# Patient Record
Sex: Male | Born: 1940 | Race: White | Hispanic: No | Marital: Married | Smoking: Former smoker
Health system: Southern US, Community
[De-identification: ages and names within clinical notes are randomized; demographics above are authoritative.]

## PROBLEM LIST (undated history)

## (undated) DIAGNOSIS — K604 Rectal fistula, unspecified: Secondary | ICD-10-CM

## (undated) DIAGNOSIS — Z973 Presence of spectacles and contact lenses: Secondary | ICD-10-CM

## (undated) DIAGNOSIS — K219 Gastro-esophageal reflux disease without esophagitis: Secondary | ICD-10-CM

## (undated) DIAGNOSIS — E785 Hyperlipidemia, unspecified: Secondary | ICD-10-CM

## (undated) DIAGNOSIS — I10 Essential (primary) hypertension: Secondary | ICD-10-CM

## (undated) HISTORY — DX: Gastro-esophageal reflux disease without esophagitis: K21.9

## (undated) HISTORY — DX: Hyperlipidemia, unspecified: E78.5

## (undated) HISTORY — DX: Rectal fistula: K60.4

## (undated) HISTORY — DX: Presence of spectacles and contact lenses: Z97.3

## (undated) HISTORY — PX: COLECTOMY: SHX59

## (undated) HISTORY — PX: ELBOW SURGERY: SHX618

## (undated) HISTORY — DX: Essential (primary) hypertension: I10

## (undated) HISTORY — DX: Rectal fistula, unspecified: K60.40

---

## 1999-04-07 ENCOUNTER — Encounter (INDEPENDENT_AMBULATORY_CARE_PROVIDER_SITE_OTHER): Payer: Self-pay | Admitting: Specialist

## 1999-04-07 ENCOUNTER — Other Ambulatory Visit: Admission: RE | Admit: 1999-04-07 | Discharge: 1999-04-07 | Payer: Self-pay | Admitting: Gastroenterology

## 2001-01-03 ENCOUNTER — Ambulatory Visit (HOSPITAL_COMMUNITY): Admission: RE | Admit: 2001-01-03 | Discharge: 2001-01-03 | Payer: Self-pay | Admitting: Orthopedic Surgery

## 2001-01-03 ENCOUNTER — Encounter: Payer: Self-pay | Admitting: Orthopedic Surgery

## 2001-01-27 ENCOUNTER — Other Ambulatory Visit: Admission: RE | Admit: 2001-01-27 | Discharge: 2001-01-27 | Payer: Self-pay | Admitting: Gastroenterology

## 2001-01-27 ENCOUNTER — Encounter: Payer: Self-pay | Admitting: Gastroenterology

## 2001-01-27 ENCOUNTER — Encounter (INDEPENDENT_AMBULATORY_CARE_PROVIDER_SITE_OTHER): Payer: Self-pay

## 2001-01-30 ENCOUNTER — Observation Stay (HOSPITAL_COMMUNITY): Admission: RE | Admit: 2001-01-30 | Discharge: 2001-01-31 | Payer: Self-pay | Admitting: Orthopedic Surgery

## 2001-01-30 ENCOUNTER — Encounter (INDEPENDENT_AMBULATORY_CARE_PROVIDER_SITE_OTHER): Payer: Self-pay | Admitting: Specialist

## 2005-02-28 ENCOUNTER — Ambulatory Visit: Payer: Self-pay | Admitting: Internal Medicine

## 2005-03-15 ENCOUNTER — Ambulatory Visit: Payer: Self-pay | Admitting: Internal Medicine

## 2005-04-11 ENCOUNTER — Ambulatory Visit: Payer: Self-pay | Admitting: Internal Medicine

## 2006-02-18 ENCOUNTER — Ambulatory Visit: Payer: Self-pay | Admitting: Internal Medicine

## 2006-05-21 ENCOUNTER — Ambulatory Visit: Payer: Self-pay | Admitting: Internal Medicine

## 2006-07-02 ENCOUNTER — Ambulatory Visit: Payer: Self-pay | Admitting: Internal Medicine

## 2006-07-09 ENCOUNTER — Ambulatory Visit: Payer: Self-pay | Admitting: Internal Medicine

## 2006-09-12 ENCOUNTER — Ambulatory Visit: Payer: Self-pay | Admitting: Internal Medicine

## 2006-09-12 LAB — CONVERTED CEMR LAB
ALT: 23 units/L (ref 0–40)
AST: 23 units/L (ref 0–37)
Chol/HDL Ratio, serum: 3.3
Cholesterol: 141 mg/dL (ref 0–200)
HDL: 42.4 mg/dL (ref >39.0)
LDL Cholesterol: 84 mg/dL (ref 0–99)
Triglyceride fasting, serum: 71 mg/dL (ref 0–149)
VLDL: 14 mg/dL (ref 0–40)

## 2006-11-13 ENCOUNTER — Ambulatory Visit: Payer: Self-pay | Admitting: Internal Medicine

## 2006-11-25 ENCOUNTER — Ambulatory Visit: Payer: Self-pay | Admitting: Internal Medicine

## 2007-03-10 ENCOUNTER — Encounter: Payer: Self-pay | Admitting: Internal Medicine

## 2007-06-11 ENCOUNTER — Encounter: Payer: Self-pay | Admitting: Internal Medicine

## 2007-06-19 ENCOUNTER — Ambulatory Visit: Payer: Self-pay | Admitting: Internal Medicine

## 2007-10-02 HISTORY — PX: COLONOSCOPY W/ POLYPECTOMY: SHX1380

## 2007-10-08 ENCOUNTER — Telehealth (INDEPENDENT_AMBULATORY_CARE_PROVIDER_SITE_OTHER): Payer: Self-pay | Admitting: *Deleted

## 2008-02-25 ENCOUNTER — Ambulatory Visit: Payer: Self-pay | Admitting: Internal Medicine

## 2008-02-25 DIAGNOSIS — K2270 Barrett's esophagus without dysplasia: Secondary | ICD-10-CM

## 2008-02-25 DIAGNOSIS — E785 Hyperlipidemia, unspecified: Secondary | ICD-10-CM

## 2008-02-25 DIAGNOSIS — K219 Gastro-esophageal reflux disease without esophagitis: Secondary | ICD-10-CM

## 2008-02-25 DIAGNOSIS — E119 Type 2 diabetes mellitus without complications: Secondary | ICD-10-CM

## 2008-02-25 DIAGNOSIS — K5730 Diverticulosis of large intestine without perforation or abscess without bleeding: Secondary | ICD-10-CM

## 2008-02-25 DIAGNOSIS — I10 Essential (primary) hypertension: Secondary | ICD-10-CM

## 2008-02-25 DIAGNOSIS — Z87442 Personal history of urinary calculi: Secondary | ICD-10-CM

## 2008-02-25 LAB — CONVERTED CEMR LAB: Cholesterol, target level: 200 mg/dL

## 2008-03-19 DIAGNOSIS — K449 Diaphragmatic hernia without obstruction or gangrene: Secondary | ICD-10-CM | POA: Insufficient documentation

## 2008-04-22 ENCOUNTER — Ambulatory Visit: Payer: Self-pay | Admitting: Internal Medicine

## 2008-04-30 ENCOUNTER — Encounter (INDEPENDENT_AMBULATORY_CARE_PROVIDER_SITE_OTHER): Payer: Self-pay | Admitting: *Deleted

## 2008-04-30 LAB — CONVERTED CEMR LAB
AST: 19 units/L (ref 0–37)
BUN: 10 mg/dL (ref 6–23)
Basophils Absolute: 0.1 10*3/uL (ref 0.0–0.1)
Bilirubin, Direct: 0.1 mg/dL (ref 0.0–0.3)
Cholesterol: 124 mg/dL (ref 0–200)
Creatinine,U: 118.5 mg/dL
HDL: 32.3 mg/dL — ABNORMAL LOW (ref >39.0)
Hemoglobin: 14.5 g/dL (ref 13.0–17.0)
Hgb A1c MFr Bld: 6.4 % — ABNORMAL HIGH (ref 4.6–6.0)
LDL Cholesterol: 71 mg/dL (ref 0–99)
Lymphocytes Relative: 18.7 % (ref 12.0–46.0)
MCHC: 35.4 g/dL (ref 30.0–36.0)
Microalb, Ur: 0.5 mg/dL (ref 0.0–1.9)
Monocytes Absolute: 0.4 10*3/uL (ref 0.1–1.0)
Neutro Abs: 3.7 10*3/uL (ref 1.4–7.7)
Platelets: 253 10*3/uL (ref 150–400)
Potassium: 4.1 meq/L (ref 3.5–5.1)
RDW: 12.3 % (ref 11.5–14.6)
Total Bilirubin: 1.5 mg/dL — ABNORMAL HIGH (ref 0.3–1.2)
Triglycerides: 102 mg/dL (ref 0–149)

## 2008-05-18 ENCOUNTER — Ambulatory Visit: Payer: Self-pay | Admitting: Gastroenterology

## 2008-06-09 ENCOUNTER — Encounter: Payer: Self-pay | Admitting: Gastroenterology

## 2008-06-09 ENCOUNTER — Ambulatory Visit: Payer: Self-pay | Admitting: Gastroenterology

## 2008-06-11 ENCOUNTER — Encounter: Payer: Self-pay | Admitting: Gastroenterology

## 2008-08-13 ENCOUNTER — Telehealth (INDEPENDENT_AMBULATORY_CARE_PROVIDER_SITE_OTHER): Payer: Self-pay | Admitting: *Deleted

## 2008-09-09 ENCOUNTER — Ambulatory Visit: Payer: Self-pay | Admitting: Internal Medicine

## 2008-09-09 DIAGNOSIS — Z860100 Personal history of colon polyps, unspecified: Secondary | ICD-10-CM | POA: Insufficient documentation

## 2008-09-10 ENCOUNTER — Ambulatory Visit: Payer: Self-pay | Admitting: Internal Medicine

## 2008-09-12 LAB — CONVERTED CEMR LAB
Alkaline Phosphatase: 60 units/L (ref 39–117)
BUN: 17 mg/dL (ref 6–23)
Bilirubin, Direct: 0.1 mg/dL (ref 0.0–0.3)
Creatinine, Ser: 1.3 mg/dL (ref 0.4–1.5)
Hgb A1c MFr Bld: 6.4 % — ABNORMAL HIGH (ref 4.6–6.0)
Microalb Creat Ratio: 1.4 mg/g (ref 0.0–30.0)
Potassium: 4.5 meq/L (ref 3.5–5.1)
Total Bilirubin: 1.1 mg/dL (ref 0.3–1.2)
Total CHOL/HDL Ratio: 4.4
VLDL: 44 mg/dL — ABNORMAL HIGH (ref 0–40)

## 2008-09-13 ENCOUNTER — Encounter (INDEPENDENT_AMBULATORY_CARE_PROVIDER_SITE_OTHER): Payer: Self-pay | Admitting: *Deleted

## 2008-12-29 ENCOUNTER — Telehealth (INDEPENDENT_AMBULATORY_CARE_PROVIDER_SITE_OTHER): Payer: Self-pay | Admitting: *Deleted

## 2009-03-04 ENCOUNTER — Ambulatory Visit: Payer: Self-pay | Admitting: Internal Medicine

## 2009-03-04 LAB — CONVERTED CEMR LAB
Hgb A1c MFr Bld: 6.5 % (ref 4.6–6.5)
LDL Cholesterol: 85 mg/dL (ref 0–99)
Total CHOL/HDL Ratio: 4
Triglycerides: 137 mg/dL (ref 0.0–149.0)

## 2009-03-08 ENCOUNTER — Ambulatory Visit: Payer: Self-pay | Admitting: Internal Medicine

## 2009-05-04 ENCOUNTER — Encounter: Payer: Self-pay | Admitting: Internal Medicine

## 2009-08-04 ENCOUNTER — Telehealth (INDEPENDENT_AMBULATORY_CARE_PROVIDER_SITE_OTHER): Payer: Self-pay | Admitting: *Deleted

## 2009-08-16 ENCOUNTER — Telehealth (INDEPENDENT_AMBULATORY_CARE_PROVIDER_SITE_OTHER): Payer: Self-pay | Admitting: *Deleted

## 2009-09-06 ENCOUNTER — Ambulatory Visit: Payer: Self-pay | Admitting: Internal Medicine

## 2009-09-11 LAB — CONVERTED CEMR LAB
Hgb A1c MFr Bld: 6.3 % (ref 4.6–6.5)
Microalb, Ur: 2.4 mg/dL — ABNORMAL HIGH (ref 0.0–1.9)

## 2009-09-12 ENCOUNTER — Encounter (INDEPENDENT_AMBULATORY_CARE_PROVIDER_SITE_OTHER): Payer: Self-pay | Admitting: *Deleted

## 2009-12-19 ENCOUNTER — Ambulatory Visit: Payer: Self-pay | Admitting: Gastroenterology

## 2009-12-19 DIAGNOSIS — R1013 Epigastric pain: Secondary | ICD-10-CM | POA: Insufficient documentation

## 2009-12-19 DIAGNOSIS — R159 Full incontinence of feces: Secondary | ICD-10-CM | POA: Insufficient documentation

## 2009-12-19 DIAGNOSIS — K589 Irritable bowel syndrome without diarrhea: Secondary | ICD-10-CM | POA: Insufficient documentation

## 2009-12-20 ENCOUNTER — Encounter: Payer: Self-pay | Admitting: Gastroenterology

## 2009-12-22 DIAGNOSIS — E538 Deficiency of other specified B group vitamins: Secondary | ICD-10-CM | POA: Insufficient documentation

## 2009-12-22 LAB — CONVERTED CEMR LAB
Ferritin: 39.8 ng/mL (ref 22.0–322.0)
Saturation Ratios: 32.9 % (ref 20.0–50.0)
Transferrin: 232.4 mg/dL (ref 212.0–360.0)

## 2009-12-23 ENCOUNTER — Ambulatory Visit (HOSPITAL_COMMUNITY): Admission: RE | Admit: 2009-12-23 | Discharge: 2009-12-23 | Payer: Self-pay | Admitting: Gastroenterology

## 2009-12-23 ENCOUNTER — Ambulatory Visit: Payer: Self-pay | Admitting: Gastroenterology

## 2009-12-23 IMAGING — US US ABDOMEN COMPLETE
1 series · 14 of 17 positions shown · non-contrast
Comparison: None.

CLINICAL DATA: Abdominal pain.

ABDOMINAL ULTRASOUND COMPLETE

[Series 1: us abdomen complete · 0.26mm/px · 14 of 17 slices shown]
[im 1/17]
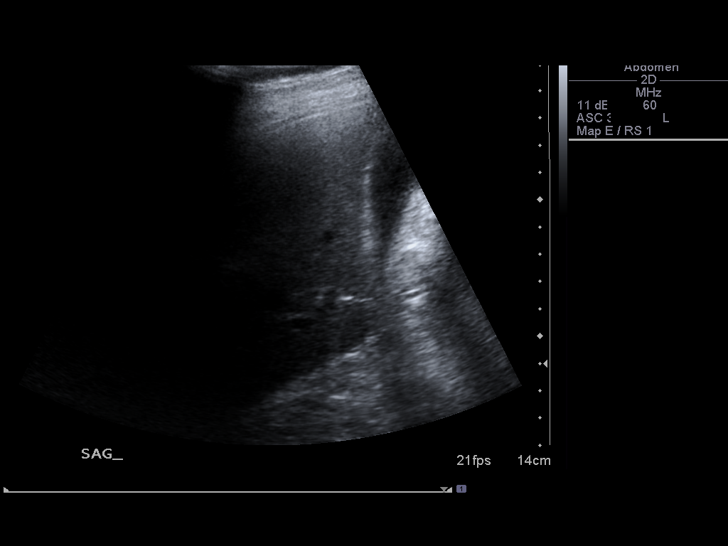
[im 2/17]
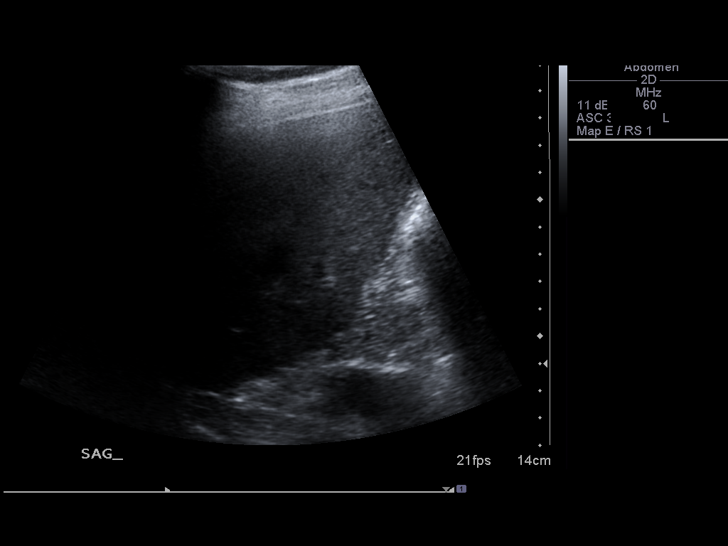
[im 4/17]
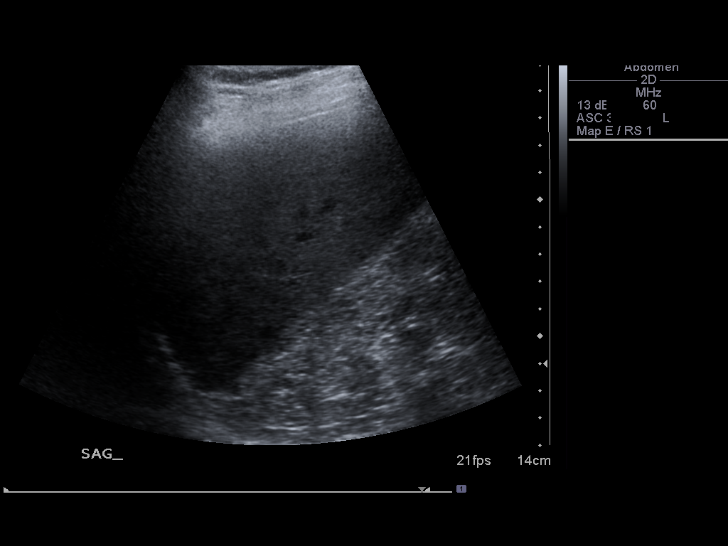
[im 5/17]
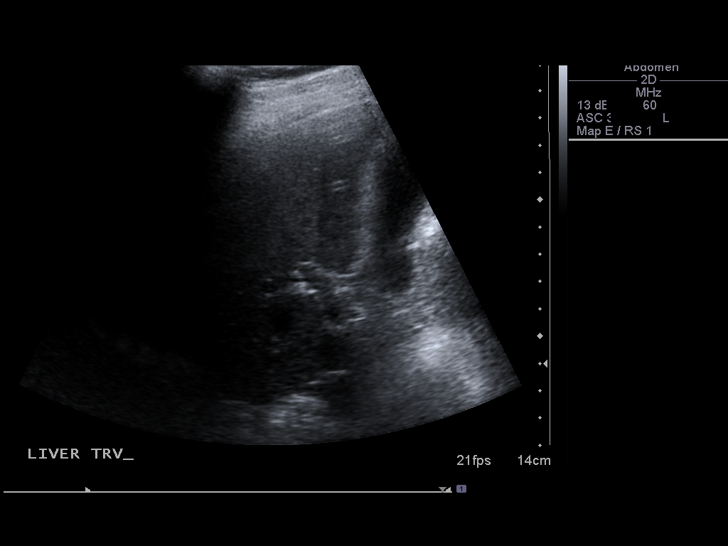
[im 6/17]
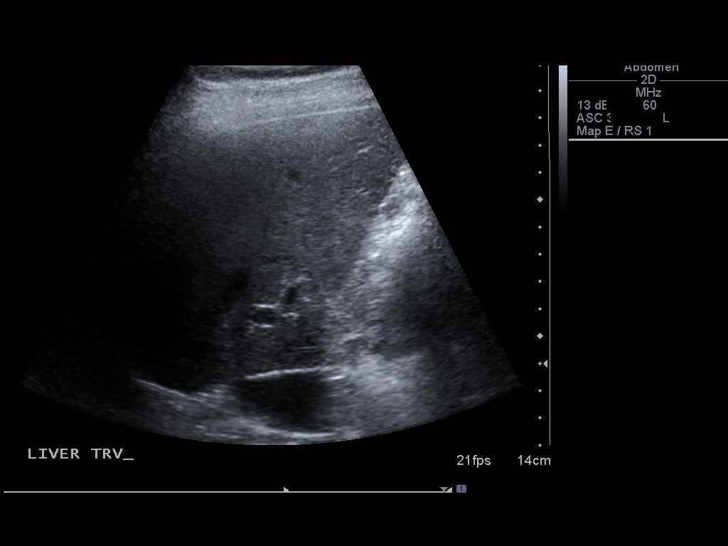
[im 7/17]
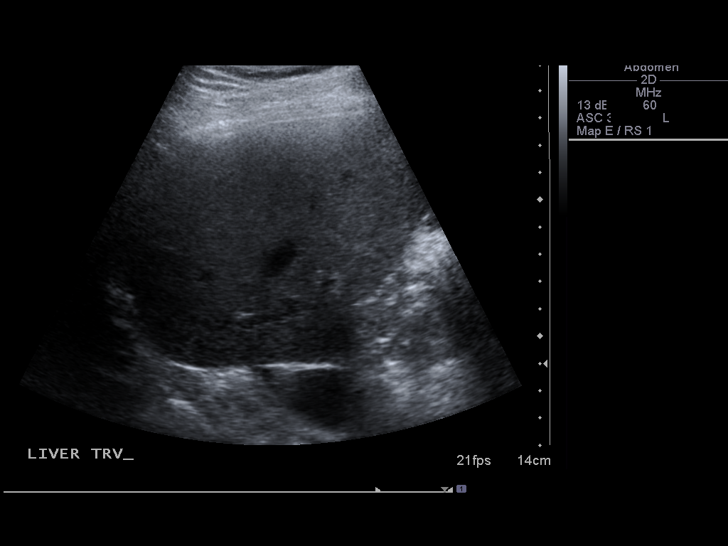
[im 8/17]
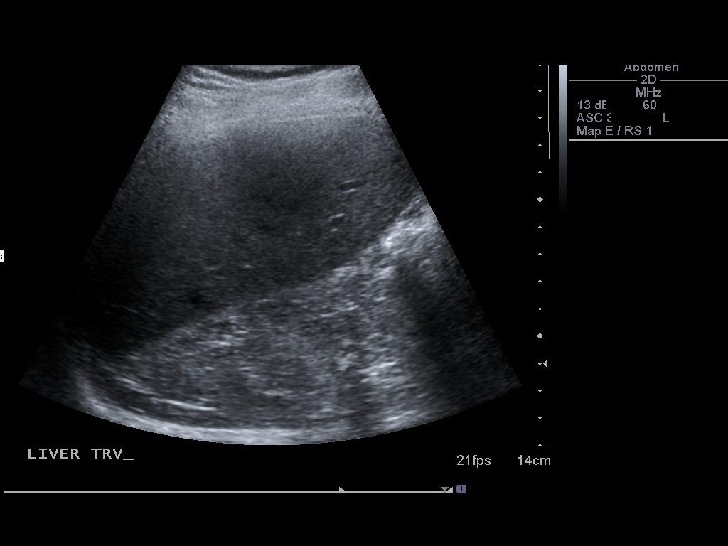
[im 10/17]
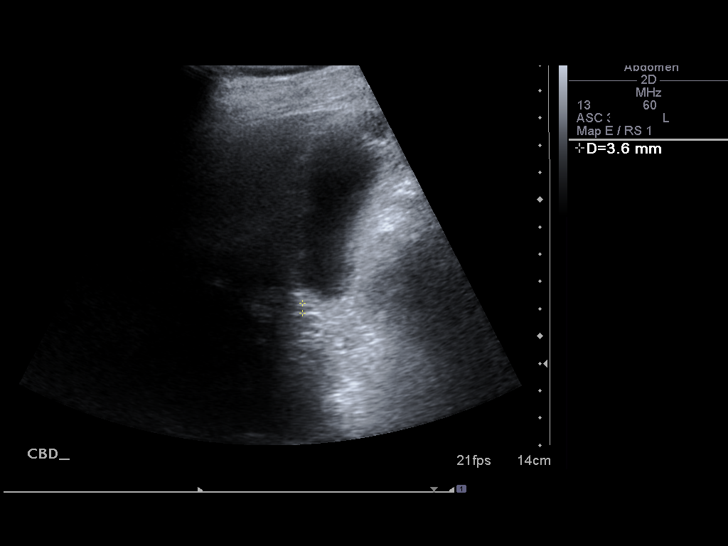
[im 11/17]
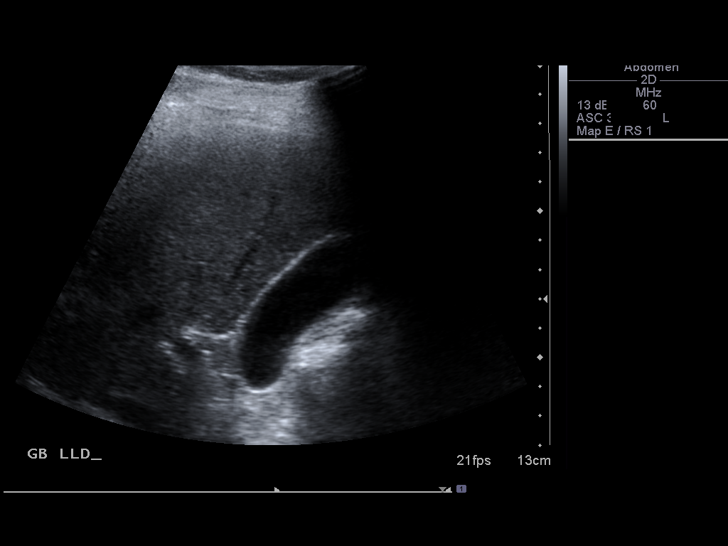
[im 12/17]
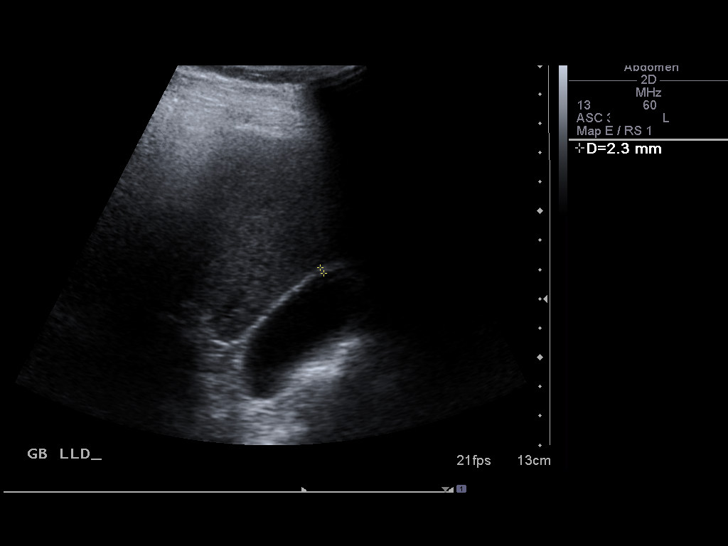
[im 13/17]
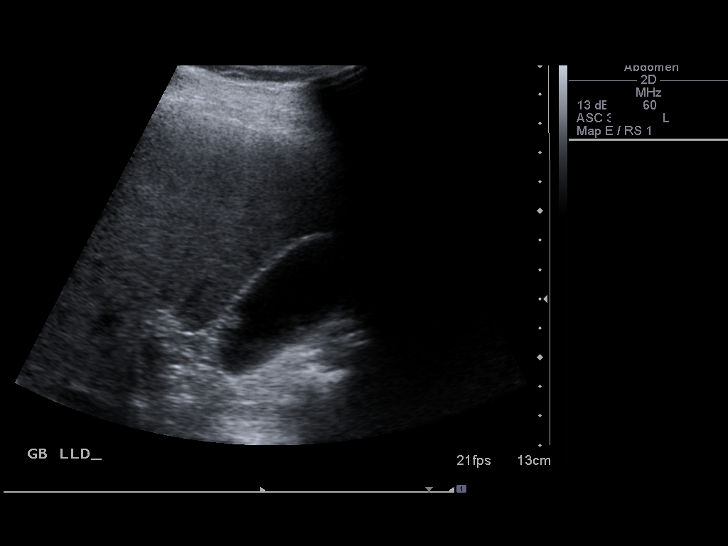
[im 14/17]
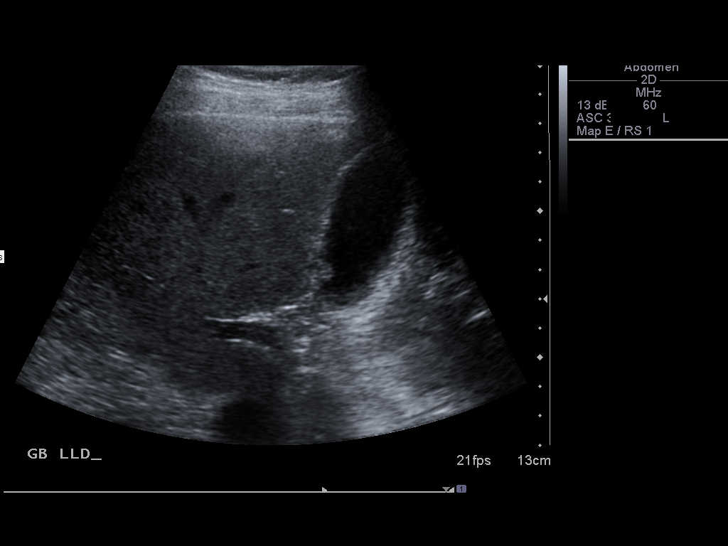
[im 16/17]
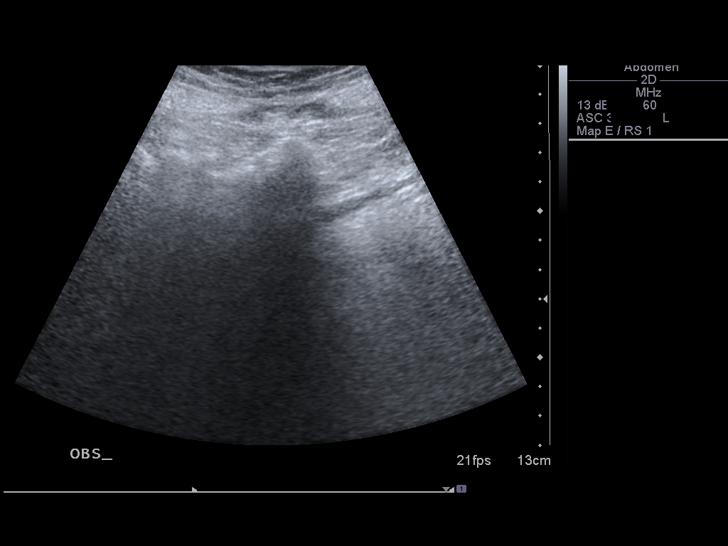
[im 17/17]
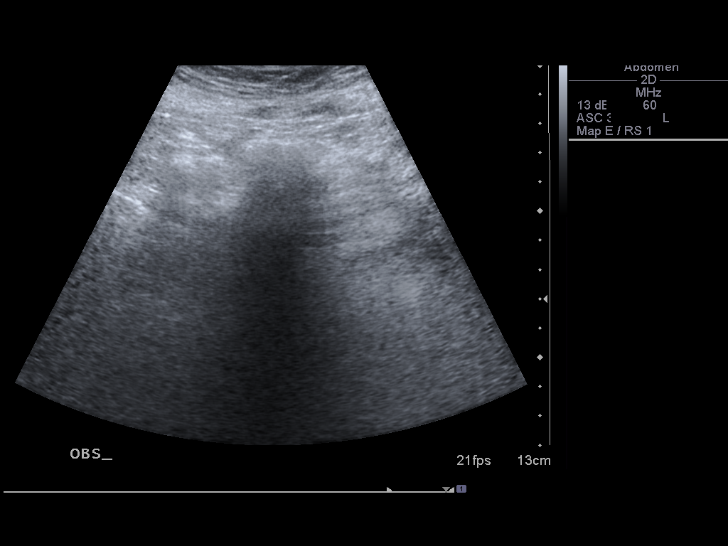

[14 of 17 positions shown; findings below may reference images not displayed]

FINDINGS: Gallbladder:  No gallstones, gallbladder wall thickening, or
pericholecystic fluid. 5 - 6 mm gallbladder polyp.

Common Bile Duct:  Within normal limits in caliber.

Liver:  No focal lesion identified.  Within normal limits in
parenchymal echogenicity.

IVC:  Appears normal.

Pancreas: Suboptimal visualization of the pancreas due to overlying
bowel gas.

Spleen:  Within normal limits in size and echotexture. 4.7 cm
splenic length.

Right kidney:  Normal in size and parenchymal echogenicity.  No
evidence of mass or hydronephrosis. Echogenic focus in the right
kidney may represent a stone.  10.4 cm right renal length.

Left kidney:  Normal in size and parenchymal echogenicity. 10.7 cm
left renal length.  2.7 x 2.7 x 3.1 cm cyst in the upper pole.
x 2.6 x 1.9 cm cyst in the upper pole.

Abdominal Aorta:  No aneurysm identified. 2.5 cm maximum diameter
of the aorta.
IMPRESSION: Gallbladder polyp.  No gallstones.  No biliary ductal dilatation.
Left renal cyst.

## 2009-12-29 ENCOUNTER — Telehealth: Payer: Self-pay | Admitting: Gastroenterology

## 2009-12-30 ENCOUNTER — Ambulatory Visit: Payer: Self-pay | Admitting: Gastroenterology

## 2010-01-02 ENCOUNTER — Telehealth (INDEPENDENT_AMBULATORY_CARE_PROVIDER_SITE_OTHER): Payer: Self-pay | Admitting: *Deleted

## 2010-01-03 ENCOUNTER — Encounter (INDEPENDENT_AMBULATORY_CARE_PROVIDER_SITE_OTHER): Payer: Self-pay

## 2010-01-05 ENCOUNTER — Encounter (INDEPENDENT_AMBULATORY_CARE_PROVIDER_SITE_OTHER): Payer: Self-pay

## 2010-01-05 ENCOUNTER — Ambulatory Visit: Payer: Self-pay | Admitting: Gastroenterology

## 2010-01-06 ENCOUNTER — Ambulatory Visit: Payer: Self-pay | Admitting: Gastroenterology

## 2010-01-11 ENCOUNTER — Ambulatory Visit: Payer: Self-pay | Admitting: Gastroenterology

## 2010-01-11 ENCOUNTER — Telehealth: Payer: Self-pay | Admitting: Gastroenterology

## 2010-01-13 ENCOUNTER — Telehealth: Payer: Self-pay | Admitting: Gastroenterology

## 2010-01-19 ENCOUNTER — Ambulatory Visit (HOSPITAL_COMMUNITY): Admission: RE | Admit: 2010-01-19 | Discharge: 2010-01-19 | Payer: Self-pay | Admitting: Gastroenterology

## 2010-01-24 ENCOUNTER — Telehealth: Payer: Self-pay | Admitting: Gastroenterology

## 2010-02-03 ENCOUNTER — Ambulatory Visit: Payer: Self-pay | Admitting: Gastroenterology

## 2010-03-01 ENCOUNTER — Telehealth: Payer: Self-pay | Admitting: Gastroenterology

## 2010-03-01 DIAGNOSIS — R1012 Left upper quadrant pain: Secondary | ICD-10-CM

## 2010-03-03 ENCOUNTER — Ambulatory Visit: Payer: Self-pay | Admitting: Gastroenterology

## 2010-03-03 LAB — CONVERTED CEMR LAB: Creatinine, Ser: 1.1 mg/dL (ref 0.4–1.5)

## 2010-03-08 ENCOUNTER — Ambulatory Visit: Payer: Self-pay | Admitting: Cardiology

## 2010-03-10 ENCOUNTER — Ambulatory Visit: Payer: Self-pay | Admitting: Gastroenterology

## 2010-03-16 ENCOUNTER — Telehealth: Payer: Self-pay | Admitting: Gastroenterology

## 2010-03-28 ENCOUNTER — Telehealth: Payer: Self-pay | Admitting: Gastroenterology

## 2010-03-30 ENCOUNTER — Ambulatory Visit: Payer: Self-pay | Admitting: Gastroenterology

## 2010-04-04 ENCOUNTER — Ambulatory Visit: Payer: Self-pay | Admitting: Gastroenterology

## 2010-04-11 ENCOUNTER — Telehealth: Payer: Self-pay | Admitting: Gastroenterology

## 2010-04-12 ENCOUNTER — Encounter (INDEPENDENT_AMBULATORY_CARE_PROVIDER_SITE_OTHER): Payer: Self-pay | Admitting: *Deleted

## 2010-04-12 DIAGNOSIS — K861 Other chronic pancreatitis: Secondary | ICD-10-CM

## 2010-04-18 ENCOUNTER — Telehealth: Payer: Self-pay | Admitting: Gastroenterology

## 2010-04-27 ENCOUNTER — Ambulatory Visit: Payer: Self-pay | Admitting: Gastroenterology

## 2010-04-27 ENCOUNTER — Ambulatory Visit (HOSPITAL_COMMUNITY): Admission: RE | Admit: 2010-04-27 | Discharge: 2010-04-27 | Payer: Self-pay | Admitting: Gastroenterology

## 2010-05-03 ENCOUNTER — Ambulatory Visit: Payer: Self-pay | Admitting: Internal Medicine

## 2010-05-05 ENCOUNTER — Ambulatory Visit: Payer: Self-pay | Admitting: Gastroenterology

## 2010-05-08 LAB — CONVERTED CEMR LAB
Hgb A1c MFr Bld: 6.4 % (ref 4.6–6.5)
Microalb Creat Ratio: 0.3 mg/g (ref 0.0–30.0)
PSA: 2.8 ng/mL (ref 0.10–4.00)

## 2010-05-19 ENCOUNTER — Telehealth: Payer: Self-pay | Admitting: Gastroenterology

## 2010-06-01 ENCOUNTER — Encounter: Payer: Self-pay | Admitting: Internal Medicine

## 2010-06-02 ENCOUNTER — Ambulatory Visit: Payer: Self-pay | Admitting: Gastroenterology

## 2010-06-07 ENCOUNTER — Encounter: Payer: Self-pay | Admitting: Internal Medicine

## 2010-06-30 ENCOUNTER — Ambulatory Visit: Payer: Self-pay | Admitting: Gastroenterology

## 2010-07-28 ENCOUNTER — Ambulatory Visit: Payer: Self-pay | Admitting: Gastroenterology

## 2010-08-08 ENCOUNTER — Ambulatory Visit: Payer: Self-pay | Admitting: Gastroenterology

## 2010-08-08 DIAGNOSIS — K612 Anorectal abscess: Secondary | ICD-10-CM

## 2010-08-09 ENCOUNTER — Encounter: Payer: Self-pay | Admitting: Internal Medicine

## 2010-08-17 ENCOUNTER — Ambulatory Visit (HOSPITAL_COMMUNITY): Admission: RE | Admit: 2010-08-17 | Discharge: 2010-08-21 | Payer: Self-pay | Admitting: General Surgery

## 2010-08-17 HISTORY — PX: BLADDER REPAIR: SHX76

## 2010-08-17 HISTORY — PX: ABDOMINAL EXPLORATION SURGERY: SHX538

## 2010-08-17 HISTORY — PX: DIAGNOSTIC LAPAROSCOPY: SUR761

## 2010-08-31 ENCOUNTER — Encounter: Payer: Self-pay | Admitting: Internal Medicine

## 2010-09-05 ENCOUNTER — Ambulatory Visit: Payer: Self-pay | Admitting: Gastroenterology

## 2010-10-06 ENCOUNTER — Ambulatory Visit
Admission: RE | Admit: 2010-10-06 | Discharge: 2010-10-06 | Payer: Self-pay | Source: Home / Self Care | Attending: Gastroenterology | Admitting: Gastroenterology

## 2010-10-25 ENCOUNTER — Encounter: Payer: Self-pay | Admitting: Internal Medicine

## 2010-10-25 ENCOUNTER — Encounter: Payer: Self-pay | Admitting: Gastroenterology

## 2010-10-25 ENCOUNTER — Telehealth: Payer: Self-pay | Admitting: Gastroenterology

## 2010-10-25 ENCOUNTER — Ambulatory Visit
Admission: RE | Admit: 2010-10-25 | Discharge: 2010-10-25 | Payer: Self-pay | Source: Home / Self Care | Attending: Gastroenterology | Admitting: Gastroenterology

## 2010-10-31 ENCOUNTER — Telehealth (INDEPENDENT_AMBULATORY_CARE_PROVIDER_SITE_OTHER): Payer: Self-pay | Admitting: *Deleted

## 2010-10-31 NOTE — Progress Notes (Signed)
Summary: results  Phone Note Call from Patient Call back at Home Phone [phone]   Caller: Patient Call For: Jarold Motto Reason for Call: Talk to Nurse Summary of Call: Patient would like ultrasound results Initial call taken by: Tawni Levy,  December 29, 2009 2:43 PM  Follow-up for Phone Call        Pt given results of [place].  Pt states he is still having problems. Having Bloating and abd fullness that occurs a few hours after eating lunch.  This feeling lasts the remainder of the day.  Pt states he usually does not want to eat dinner because he still feels full.  Pt can eat breakfast which is usually cereal or oatmeal and he will feel fine.  Symptoms occur after lunch no matter what he ests.  Pt asks what else should be done. Hhe is very concerned. Follow-up by: Ashok Cordia RN,  December 29, 2009 3:38 PM  Additional Follow-up for Phone Call Additional follow up Details #1::        schedule gastric emptying scan and endoscopy... Additional Follow-up by: Mardella Layman MD [facility],  December 30, 2009 8:32 AM     Appended Document: results Endoscopy scheduled for 01/11/10, pervisit scheduled for 01/05/10. gastric emptying study scheuled for 01/19/10 at 9:00 at [facility].  Pt needs to be NPo aftr [place]. Pt notified of appts.   Clinical Lists Changes  Orders: Added new Test order of Gastric Emptying Scan (GES) - Signed

## 2010-10-31 NOTE — Assessment & Plan Note (Signed)
Summary: #3 of 3 weekly b12/226.2/dfs  Nurse Visit   Allergies: No Known Drug Allergies  Medication Administration  Injection # 1:    Medication: Vit B12 1000 mcg    Diagnosis: B12 DEFICIENCY (ICD-266.2)    Route: IM    Site: L deltoid    Exp Date: 11/2011    Lot #: [id number]    Mfr: [employer]    Comments: pt to schedule monthly b12 at front desk    Patient tolerated injection without complications    Given by: Chales Abrahams CMA [name]) (January 06, 2010 4:33 PM)  Orders Added: 1)  Vit B12 1000 mcg [J3420]

## 2010-10-31 NOTE — Assessment & Plan Note (Signed)
Summary: B12 shot/jms  Nurse Visit   Allergies: No Known Drug Allergies  Medication Administration  Injection # 1:    Medication: Vit B12 1000 mcg    Diagnosis: B12 DEFICIENCY (ICD-266.2)    Route: IM    Site: R deltoid    Exp Date: 4/13    Lot #: [id number]    Mfr: [employer]    Patient tolerated injection without complications    Given by: Milford Cage NCMA (June 02, 2010 4:20 PM)  Orders Added: 1)  Vit B12 1000 mcg [J3420]

## 2010-10-31 NOTE — Progress Notes (Signed)
Summary: Health Assessment Brought by Patient  Health Assessment Brought by Patient   Imported By: Lanelle Bal 05/10/2010 09:39:33  _____________________________________________________________________  External Attachment:    Type:   Image     Comment:   External Document

## 2010-10-31 NOTE — Progress Notes (Signed)
Summary: Medication  Phone Note From Pharmacy   Caller: [facility]  [phone]   Call For: Dr. Jarold Motto  Summary of Call: Has a question about the Omeprazole Script.  Ref# [id number] Initial call taken by: Karna Christmas,  January 11, 2010 9:07 AM  Follow-up for Phone Call        Pharmacist calling to make sure omeprazole should be two times a day.  two times a day is correct.  Pharmacist notified.  Follow-up by: Ashok Cordia RN,  January 11, 2010 10:20 AM

## 2010-10-31 NOTE — Progress Notes (Signed)
Summary: samples  Phone Note Call from Patient Call back at Home Phone [phone]   Caller: Patient Call For: Dr. Jarold Motto Reason for Call: Talk to Nurse Summary of Call: would like samples of Zentep Initial call taken by: Vallarie Mare,  April 18, 2010 2:03 PM  Follow-up for Phone Call        Samples given. Follow-up by: Ashok Cordia RN,  April 19, 2010 9:32 AM    New/Updated Medications: ZENPEP 20000 UNIT CPEP (PANCRELIPASE (LIP-PROT-AMYL)) 2 by mouth three times a day with meals

## 2010-10-31 NOTE — Assessment & Plan Note (Signed)
Summary: MONTHLY B12 SHOT...LSW.  Nurse Visit   Allergies: No Known Drug Allergies  Medication Administration  Injection # 1:    Medication: Vit B12 1000 mcg    Diagnosis: B12 DEFICIENCY (ICD-266.2)    Route: IM    Site: R deltoid    Exp Date: 03/2012    Lot #: [id number]    Mfr: [employer]    Patient tolerated injection without complications    Given by: Merri Ray CMA (AAMA) (July 28, 2010 4:04 PM)  Orders Added: 1)  Vit B12 1000 mcg [J3420]

## 2010-10-31 NOTE — Assessment & Plan Note (Signed)
Summary: MONTHLY B12 SHOT...LSW.  Nurse Visit   Allergies: No Known Drug Allergies  Medication Administration  Injection # 1:    Medication: Vit B12 1000 mcg    Diagnosis: B12 DEFICIENCY (ICD-266.2)    Route: IM    Site: R deltoid    Exp Date: 12/31/2011    Lot #: [id number]    Patient tolerated injection without complications    Given by: Christie Nottingham CMA [name]) (March 03, 2010 4:35 PM)  Orders Added: 1)  Vit B12 1000 mcg [J3420]

## 2010-10-31 NOTE — Letter (Signed)
Summary: [facility]   Imported By: Lanelle Bal 07/07/2010 11:23:19  _____________________________________________________________________  External Attachment:    Type:   Image     Comment:   External Document

## 2010-10-31 NOTE — Assessment & Plan Note (Signed)
Summary: FOLLOW UP/YF   History of Present Illness Primary GI MD: Sheryn Bison MD FACP FAGA Primary Provider: Marga Melnick, MD Requesting Provider: n/a Chief Complaint: Pt wants to discuss a liquid leakage  from his rectum. Pt states he doesn't even know when it happens but he notices a clear liquid in his underpants every day. Pt does not associate with activity or stools but he does have some BRB rectal bleeding after he wipes. Pt states he is having exploratory surgery on 17th and wants to make sure this problem is not a problem before that surgery. History of Present Illness:   70 year old white male who has chronic abdominal pain related to previous partial sigmoid colectomy in 1997 for diverticulitis. He has planned laparoscopic surgery by Dr. Andrey Campanile on the 17th of this month. His head third GI workups in the past which otherwise have been unremarkable.  He now presents with rectal" leakage" and rectal discomfort for the last week. His bowels are regular and he denies significant rectal bleeding or systemic complaints. Last colonoscopy was 2 years ago. He does take regular metaprolol for GERD. He's had no anorexia, weight loss, fever, chills, or systemic complaints.   GI Review of Systems      Denies abdominal pain, acid reflux, belching, bloating, chest pain, dysphagia with liquids, dysphagia with solids, heartburn, loss of appetite, nausea, vomiting, vomiting blood, weight loss, and  weight gain.      Reports rectal bleeding.     Denies anal fissure, black tarry stools, change in bowel habit, constipation, diarrhea, diverticulosis, fecal incontinence, heme positive stool, hemorrhoids, irritable bowel syndrome, jaundice, light color stool, liver problems, and  rectal pain.    Current Medications (verified): 1)  Pravastatin Sodium 40 Mg Tabs (Pravastatin Sodium) .... Take One Tablet Each Evening. 2)  Actos 30 Mg  Tabs (Pioglitazone Hcl) .Marland Kitchen.. 1 By Mouth Qd 3)  Lisinopril 40 Mg   Tabs (Lisinopril) .Marland Kitchen.. 1 Once Daily 4)  Adult Aspirin Ec Low Strength 81 Mg  Tbec (Aspirin) .... Take One Tablet Daily 5)  Folic Acid 800 Mcg Tabs (Folic Acid) .Marland Kitchen.. 1 By Mouth Once Daily 6)  Multivitamins   Caps (Multiple Vitamin) .... Take One Tablet Daily 7)  Omeprazole 40 Mg  Cpdr (Omeprazole) .Marland Kitchen.. 1 By Mouth Two Times A Day 8)  Fish Oil 1200 Mg Caps (Omega-3 Fatty Acids) .Marland Kitchen.. 1 By Mouth Once Daily 9)  Cyanocobalamin 1000 Mcg/ml Inj Soln (Cyanocobalamin) .... Monthly Injections 10)  Zenpep 20000 Unit Cpep (Pancrelipase (Lip-Prot-Amyl)) .... 2 By Mouth Three Times A Day With Meals 11)  Tramadol Hcl 50 Mg Tabs (Tramadol Hcl) .... One Tablet By Mouth Every 6-8 Hours As Needed For Pain  Allergies (verified): No Known Drug Allergies  Past History:  Past medical, surgical, family and social histories (including risk factors) reviewed for relevance to current acute and chronic problems.  Past Medical History: Reviewed history from 05/03/2010 and no changes required. Diabetes mellitus, type II (A1c 6.8% in 2004) Hyperlipidemia Hypertension GERD with  Barrett's, Dr Jarold Motto Nephrolithiasis, hx of X 3, Dr Vonita Moss Diverticulosis, colon Colonic polyps, hx of Gastroparesis  Past Surgical History: Reviewed history from 05/03/2010 and no changes required. Colectomy, partial for diverticulitis 1997; Endoscopy : gastric polyp 2006,neg 2009 Elbow surgery Colon polypectomy 2009, Dr Jarold Motto  Family History: Reviewed history from 05/03/2010 and no changes required. Adopted, no family history  Social History: Reviewed history from 05/03/2010 and no changes required. No diet Occupation:IT Tax inspector) Patient is a former smoker.  Alcohol Use - yes/ occ. 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  Review of Systems  The patient denies allergy/sinus, anemia, anxiety-new, arthritis/joint pain, back pain, blood in urine,  breast changes/lumps, change in vision, confusion, cough, coughing up blood, depression-new, fainting, fatigue, fever, headaches-new, hearing problems, heart murmur, heart rhythm changes, itching, menstrual pain, muscle pains/cramps, night sweats, nosebleeds, pregnancy symptoms, shortness of breath, skin rash, sleeping problems, sore throat, swelling of feet/legs, swollen lymph glands, thirst - excessive , urination - excessive , urination changes/pain, urine leakage, vision changes, and voice change.    Vital Signs:  Patient profile:   70 year old male Height:      65 inches Weight:      162.25 pounds Pulse rate:   66 / minute Pulse rhythm:   regular BP sitting:   112 / 64  (right arm) Cuff size:   regular  Vitals Entered By: Christie Nottingham CMA [name]) (August 08, 2010 10:50 AM)  Physical Exam  General:  Well developed, well nourished, no acute distress.healthy appearing.   Head:  Normocephalic and atraumatic. Eyes:  PERRLA, no icterus. Abdomen:  Soft, nontender and nondistended. No masses, hepatosplenomegaly or hernias noted. Normal bowel sounds.Tenderness in the left midabdominal area over the sigmoid colon. Abdominal exam otherwise unremarkable Rectal:  Posterior fistula noted with erythematous nodular margin and tenderness with stool expressed from the fistula opening with compression. Digital exam not performed. Psych:  Alert and cooperative. Normal mood and affect.anxious.  anxious.     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 I have placed him on b.i.d. Sitz baths with local Neosporin cream, metronidazole 500 mg twice a day p.o., and will have him check with Dr. Andrey Campanile in surgery before his planned laparoscopic surgery for his intestinal adhesions. He may need this fistula drained and opened further.  Problem # 2:  IBS  (ICD-564.1) Assessment: Improved  Patient Instructions: 1)  Copy sent to : Marga Melnick, MD & Gaynelle Adu, MD 2)  Your appt is scheduled for 08/09/2010 arrive at 9:00am, to see Dr. Gaynelle Adu, MD at [facility]. 3)  Do Sitz baths, follow handout. 4)  Your prescription(s) have been sent to you pharmacy.  5)  Use neosporin per rectum 3-4 times a day. 6)  The medication list was reviewed and reconciled.  All changed / newly prescribed medications were explained.  A complete medication list was provided to the patient / caregiver. Prescriptions: METRONIDAZOLE 500 MG TABS (METRONIDAZOLE) take one by mouth two times a day for 10 days  #20 x 0   Entered by:   Harlow Mares CMA (AAMA)   Authorized by:   Mardella Layman MD [facility]   Signed by:   Mardella Layman MD [facility] on 08/08/2010   Method used:   Electronically to        [facility] (retail)       [address]       [place], [place]  [address]       Ph: [phone]       Fax: [phone]   RxID:   [id number]

## 2010-10-31 NOTE — Assessment & Plan Note (Signed)
Summary: #1 of 3 weekly inj/266.2/dfs  Nurse Visit   Allergies: No Known Drug Allergies  Medication Administration  Injection # 1:    Medication: Vit B12 1000 mcg    Diagnosis: B12 DEFICIENCY (ICD-266.2)    Route: IM    Site: R deltoid    Exp Date: 11/12    Lot #: [id number]    Mfr: [employer]    Patient tolerated injection without complications    Given by: Milford Cage NCMA (December 23, 2009 4:36 PM)  Orders Added: 1)  Vit B12 1000 mcg [J3420]

## 2010-10-31 NOTE — Progress Notes (Signed)
Summary: speak ot nurse  Phone Note Call from Patient Call back at Home Phone [phone]   Caller: Patient Call For: Jarold Motto Reason for Call: Talk to Nurse Summary of Call: Patient would like to speak to nurse regaring test that h is suppose ti get done Initial call taken by: Tawni Levy,  April 11, 2010 4:45 PM  Follow-up for Phone Call        Pt needs EUS.  See OV note. Follow-up by: Ashok Cordia RN,  April 12, 2010 9:38 AM  Additional Follow-up for Phone Call Additional follow up Details #1::        ok, please talk with patty about scheduling a upper EUS, radial+/- linear, next available EUS thursday.  thanks Additional Follow-up by: Rachael Fee MD,  April 12, 2010 10:42 AM  New Problems: CHRONIC PANCREATITIS (ICD-577.1)   Additional Follow-up for Phone Call Additional follow up Details #2::    appt scheduled need to review meds and instruct pt. left message on machine to call back Chales Abrahams CMA [name])  April 12, 2010 3:03 PM  pt aware he has been instructed and meds reviewed Follow-up by: Chales Abrahams CMA [name]),  April 12, 2010 4:28 PM  New Problems: CHRONIC PANCREATITIS (ICD-577.1)

## 2010-10-31 NOTE — Letter (Signed)
Summary: Diabetic Instructions   Gastroenterology  [address] [place], [place] [address]   Phone: [phone]  Fax: [phone]    Dean Green May 01, 1941 MRN: [id number]   X   ORAL DIABETIC MEDICATION INSTRUCTIONS  The day before your procedure:   Take your diabetic pill as you do normally  The day of your procedure:   Do not take your diabetic pill    We will check your blood sugar levels during the admission process and again in Recovery before discharging you home  ________________________________________________________________________

## 2010-10-31 NOTE — Progress Notes (Signed)
Summary: speak to nurse  Phone Note Call from Patient Call back at Home Phone [phone]   Caller: Patient Call For: Jarold Motto Reason for Call: Talk to Nurse Summary of Call: Patient wants to spak to nurse regarding procedure her had done Wed Initial call taken by: Tawni Levy,  January 13, 2010 2:41 PM  Follow-up for Phone Call        Pt did not remember what he was told after EGD due to being drowsy.  Went over results with pt.  Follow-up by: Ashok Cordia RN,  January 13, 2010 2:55 PM

## 2010-10-31 NOTE — Progress Notes (Signed)
Summary: Triage  Phone Note Call from Patient Call back at Home Phone [phone]   Caller: Patient Call For: Dr. Jarold Motto Reason for Call: Talk to Nurse Summary of Call: pt. is still having left sided pain, normally after he eats....would like to discuss Initial call taken by: Karna Christmas,  May 19, 2010 4:07 PM  Follow-up for Phone Call        this ios Pt cont's to have spells of LUQ pain.  Spells are not a frequent but when he has one it is very painful.  Dr. Christella Hartigan mentioned to pt that this could be from adhesions from previous surgery.  Pt asks if Dr Jarold Motto thinks it would be helpful to see a surgeon.  If surgery would help pt would be willing to have this done.   Follow-up by: Ashok Cordia RN,  May 22, 2010 8:31 AM  Additional Follow-up for Phone Call Additional follow up Details #1::        this is ok.Marland KitchenMarland Kitchen??? who surgeon is. Additional Follow-up by: Mardella Layman MD Clementeen Graham,  May 22, 2010 9:03 AM    Additional Follow-up for Phone Call Additional follow up Details #2::    Pt states Dr. Earlene Plater did the surgery 1 years ago.  Pt understands that Dr. Earlene Plater is not doing surgery any longer.  Asks who Dr. Jarold Motto would reccomend? Follow-up by: Ashok Cordia RN,  May 22, 2010 9:30 AM  Additional Follow-up for Phone Call Additional follow up Details #3:: Details for Additional Follow-up Action Taken: Dr. Gaynelle Adu at [facility]   Pt notified.  Records faxed to [facility].  Ashok Cordia RN  May 22, 2010 10:52 AM Additional Follow-up by: Mardella Layman MD Clementeen Graham,  May 22, 2010 9:40 AM   Appended Document: Triage    Clinical Lists Changes  Orders: Added new Test order of [facility] ([facility]) - Signed

## 2010-10-31 NOTE — Assessment & Plan Note (Signed)
Summary: MONTHLY B12 SHOT...LSW.  Nurse Visit   Allergies: No Known Drug Allergies  Medication Administration  Injection # 1:    Medication: Vit B12 1000 mcg    Diagnosis: B12 DEFICIENCY (ICD-266.2)    Route: IM    Site: R deltoid    Exp Date: 03/2012    Lot #: [id number]    Mfr: [employer]    Comments: pt scheduled for next monthly b12 at the front desk    Patient tolerated injection without complications    Given by: Chales Abrahams CMA [name]) (June 30, 2010 4:18 PM)  Orders Added: 1)  Vit B12 1000 mcg [J3420]

## 2010-10-31 NOTE — Assessment & Plan Note (Signed)
Summary: RX RENEWAL.Marland KitchenMarland KitchenEM   History of Present Illness Visit Type: Follow-up Visit Primary GI MD: Sheryn Bison MD FACP FAGA Primary Provider: Marga Melnick, MD Requesting Provider: n/a Chief Complaint: Lower abd pain after meals, and fecal incontinence. Pt needs refill on Omeprazole.  History of Present Illness:   70 year old Caucasian male with chronic GERD on daily omeprazole who now presents with one-month history of recurrent non-epigastric discomfort worsened with eating with no specific hepatobiliary complaints. Chart review shows no evidence of previous ultrasound exam and he denies any history of gallbladder or liver disease. His appetite is good and his weight is stable.  He did use a lot of Mobic over the last several months per staph infection his left leg. He denies chronic diarrhea. He does have occasional fecal incontinence of clear material without real feces. He denies ingestion of sorbitol or fructose. He has been on omeprazole for several years and is up-to-date on his endoscopy and colonoscopy. He has type 2 diabetes well controlled with oral medications, history of diverticulosis, recurrent kidney stones, previous sigmoid resection for recurrent diverticulitis. He denies rectal bleeding, melena, or systemic complaints. He does take a daily aspirin tablet and pravastatin.   GI Review of Systems    Reports abdominal pain.     Location of  Abdominal pain: lower abdomen.    Denies acid reflux, belching, bloating, chest pain, dysphagia with liquids, dysphagia with solids, heartburn, loss of appetite, nausea, vomiting, vomiting blood, weight loss, and  weight gain.      Reports fecal incontinence.     Denies anal fissure, black tarry stools, change in bowel habit, constipation, diarrhea, diverticulosis, heme positive stool, hemorrhoids, irritable bowel syndrome, jaundice, light color stool, liver problems, rectal bleeding, and  rectal pain.    Current Medications  (verified): 1)  Pravastatin Sodium 40 Mg Tabs (Pravastatin Sodium) .... Take One Tablet Each Evening. 2)  Actos 30 Mg  Tabs (Pioglitazone Hcl) .Marland Kitchen.. 1 By Mouth Qd 3)  Lisinopril 40 Mg  Tabs (Lisinopril) .Marland Kitchen.. 1 Once Daily 4)  Adult Aspirin Ec Low Strength 81 Mg  Tbec (Aspirin) .... Take One Tablet Daily 5)  Folic Acid 800 Mcg Tabs (Folic Acid) .Marland Kitchen.. 1 By Mouth Once Daily 6)  Multivitamins   Caps (Multiple Vitamin) .... Take One Tablet Daily 7)  Omeprazole 40 Mg  Cpdr (Omeprazole) .Marland Kitchen.. 1 Each Day 30 Minutes Before Meal 8)  Fish Oil 1200 Mg Caps (Omega-3 Fatty Acids) .Marland Kitchen.. 1 By Mouth Once Daily  Allergies (verified): No Known Drug Allergies  Past History:  Past medical, surgical, family and social histories (including risk factors) reviewed for relevance to current acute and chronic problems.  Past Medical History: Reviewed history from 03/08/2009 and no changes required. Diabetes mellitus, type II (A1c 6.8% in 2004) Hyperlipidemia Hypertension GERD/ Barrett's, Dr Jarold Motto Nephrolithiasis, hx of X 3, Dr Vonita Moss Diverticulosis, colon Colonic polyps, hx of  Past Surgical History: Reviewed history from 03/08/2009 and no changes required. Colectomy, partial for diverticulitis 1997; Endo : gastric polyp 2006,neg 2009 Elbow surgery Colon polypectomy 2009, Dr Jarold Motto  Family History: Reviewed history from 02/25/2008 and no changes required. Adopted  Social History: Reviewed history from 09/09/2008 and no changes required. 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  Alcohol Use - yes/ occ. Daily Caffeine Use-4 Illicit Drug Use - no Patient gets regular exercise.  Review of Systems  The patient denies allergy/sinus, anemia, anxiety-new, arthritis/joint pain, back pain, blood in urine, breast changes/lumps, change in vision, confusion,  cough, coughing up blood, depression-new, fainting, fatigue, fever, headaches-new, hearing problems, heart  murmur, heart rhythm changes, itching, muscle pains/cramps, night sweats, nosebleeds, shortness of breath, skin rash, sleeping problems, sore throat, swelling of feet/legs, swollen lymph glands, thirst - excessive, urination - excessive, urination changes/pain, urine leakage, vision changes, and voice change.    Vital Signs:  Patient profile:   70 year old male Height:      64.75 inches Weight:      164 pounds BMI:     27.60 BSA:     1.81 Pulse rate:   60 / minute Pulse rhythm:   regular BP sitting:   124 / 62  (left arm) Cuff size:   regular  Vitals Entered By: Ok Anis CMA (December 19, 2009 11:21 AM)  Physical Exam  General:  Well developed, well nourished, no acute distress.healthy appearing.   Head:  Normocephalic and atraumatic. Eyes:  PERRLA, no icterus.exam deferred to patient's ophthalmologist.   Abdomen:  Soft, nontender and nondistended. No masses, hepatosplenomegaly or hernias noted. Normal bowel sounds.Lower midline surgical scar noted. Rectal:  Normal exam.His rectal tone appears excellent with good squeeze pressure. There no rectal masses or tenderness and formed stool is present that is guaiac negative. Prostate:  .normal size prostate.   Neurologic:  Alert and  oriented x4;  grossly normal neurologically. Psych:  Alert and cooperative. Normal mood and affect.   Impression & Recommendations:  Problem # 1:  ABDOMINAL PAIN-EPIGASTRIC (ICD-789.06) Assessment New Probable NSAID-induced mucosal damage.He is to stop NSAID use, increase omeprazole to b.i.d. dosage for several weeks, and we will check gallbladder ultrasound. I also have ordered stool antigen for H. pylori.  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 We will treat him with [facility] tabs twice a day to see how he does symptomatically. He may need anorectal manometry performed. He is to  avoid sorbitol fructose and other non-digestible carbohydrates. He is up-to-date on his colonoscopy exam.  Problem # 3:  COLONIC POLYPS, HX OF (ICD-V12.72) Assessment: Unchanged  Problem # 4:  HYPERTENSION (ICD-401.9) Assessment: Improved blood pressure today is 124/62 and he is to continue blood pressure medications per Dr. Alwyn Ren.  Problem # 5:  DIABETES MELLITUS, TYPE II (ICD-250.00) Assessment: Improved  Patient Instructions: 1)  Copy sent to : Dr. Marga Melnick 2)  Please continue current medications. 3)  increase omeprazole to b.i.d. dose each 4)  [facility] colon health probiotics b.i.d. 5)  Gallbladder ultrasound ordered 6)  Stool antigen for H. pylori ordered 7)  Stop  NSAID use. 8)  The medication list was reviewed and reconciled.  All changed / newly prescribed medications were explained.  A complete medication list was provided to the patient / caregiver.  Appended Document: RX RENEWAL.Marland KitchenMarland KitchenEM    Clinical Lists Changes  Problems: Added new problem of IBS (ICD-564.1) Medications: Changed medication from OMEPRAZOLE 40 MG  CPDR (OMEPRAZOLE) 1 each day 30 minutes before meal to OMEPRAZOLE 40 MG  CPDR (OMEPRAZOLE) 1 by mouth two times a day - Signed Rx of OMEPRAZOLE 40 MG  CPDR (OMEPRAZOLE) 1 by mouth two times a day;  #180 x 3;  Signed;  Entered by: Ashok Cordia RN;  Authorized by: Mardella Layman MD [facility];  Method used: Print then Give to Patient Orders: Added new Test order of TLB-B12, Serum-Total ONLY [phone]) - Signed Added new Test order of TLB-Ferritin (82728-FER) - Signed Added  new Test order of TLB-Folic Acid (Folate) (82746-FOL) - Signed Added new Test order of TLB-IBC Pnl (Iron/FE;Transferrin) (83550-IBC) - Signed Added new Test order of T- * Misc. Laboratory test [id number]) - Signed Added new Test order of Ultrasound Abdomen (UAS) - Signed    Prescriptions: OMEPRAZOLE 40 MG  CPDR (OMEPRAZOLE) 1 by mouth two times a day  #180 x 3   Entered by:   Ashok Cordia RN   Authorized by:   Mardella Layman MD [facility]   Signed by:   Ashok Cordia RN on 12/19/2009   Method used:   Print then Give to Patient   RxID:   [id number]

## 2010-10-31 NOTE — Assessment & Plan Note (Signed)
Summary: MONTHLY B12 SHOT...LSW.  Nurse Visit   Allergies: No Known Drug Allergies  Medication Administration  Injection # 1:    Medication: Vit B12 1000 mcg    Diagnosis: B12 DEFICIENCY (ICD-266.2)    Route: IM    Site: L deltoid    Exp Date: 4/13    Lot #: [id number]    Mfr: American Regent    Patient tolerated injection without complications    Given by: Lamona Curl CMA (AAMA) (May 05, 2010 4:13 PM)  Orders Added: 1)  Vit B12 1000 mcg [J3420]   Medication Administration  Injection # 1:    Medication: Vit B12 1000 mcg    Diagnosis: B12 DEFICIENCY (ICD-266.2)    Route: IM    Site: L deltoid    Exp Date: 4/13    Lot #: [id number]    Mfr: American Regent    Patient tolerated injection without complications    Given by: Lamona Curl CMA (AAMA) (May 05, 2010 4:13 PM)  Orders Added: 1)  Vit B12 1000 mcg [J3420]

## 2010-10-31 NOTE — Assessment & Plan Note (Signed)
Summary: Follow up after CT scan/dfs   History of Present Illness Visit Type: Follow-up Visit Primary GI MD: Sheryn Bison MD FACP FAGA Primary Provider: Marga Melnick, MD Requesting Provider: n/a Chief Complaint: F/u CT scan results. Intermittant left sided abd pains that radiate to right side if abdomen. Pt does not relate this to meals or activity.  History of Present Illness:   This Patient is a 70 year old white male with non-insulin depended diabetes well followed for many years because of diverticulosis with previous diverticulitis requiring partial colectomy in 1997. He also has chronic GERD, IBS, and had been doing well until the last several months when he has had intermittent recurrent left upper quadrant pain 30 minutes to 2 hours after meals with evidence of delayed gastric emptying by gastric emptying scan. He is on chronic Nexium therapy, and recently was placed on domperidone 10 mg t.i.d. without improvement. He continues with intermittent episodes of left upper quadrant pain and nausea. He denies any specific hepatobiliary complaints or lower gastrointestinal problems with normal bowel movements at this time.  Recent endoscopy, ultrasound, and CT scan of all been unremarkable. Last colonoscopy was 2 years ago. He denies abuse of alcohol, cigarettes, or NSAIDs. He is on B12 replacement, multivitamins, folic acid, Actos, and lisinopril. His medical physician is Dr. Marga Melnick. There's been no anorexia or weight loss.   GI Review of Systems    Reports abdominal pain.     Location of  Abdominal pain: left side.    Denies acid reflux, belching, bloating, chest pain, dysphagia with liquids, dysphagia with solids, heartburn, loss of appetite, nausea, vomiting, vomiting blood, weight loss, and  weight gain.        Denies anal fissure, black tarry stools, change in bowel habit, constipation, diarrhea, diverticulosis, fecal incontinence, heme positive stool, hemorrhoids, irritable  bowel syndrome, jaundice, light color stool, liver problems, rectal bleeding, and  rectal pain.    Current Medications (verified): 1)  Pravastatin Sodium 40 Mg Tabs (Pravastatin Sodium) .... Take One Tablet Each Evening. 2)  Actos 30 Mg  Tabs (Pioglitazone Hcl) .Marland Kitchen.. 1 By Mouth Qd 3)  Lisinopril 40 Mg  Tabs (Lisinopril) .Marland Kitchen.. 1 Once Daily 4)  Adult Aspirin Ec Low Strength 81 Mg  Tbec (Aspirin) .... Take One Tablet Daily 5)  Folic Acid 800 Mcg Tabs (Folic Acid) .Marland Kitchen.. 1 By Mouth Once Daily 6)  Multivitamins   Caps (Multiple Vitamin) .... Take One Tablet Daily 7)  Omeprazole 40 Mg  Cpdr (Omeprazole) .Marland Kitchen.. 1 By Mouth Two Times A Day 8)  Fish Oil 1200 Mg Caps (Omega-3 Fatty Acids) .Marland Kitchen.. 1 By Mouth Once Daily 9)  Cyanocobalamin 1000 Mcg/ml Inj Soln (Cyanocobalamin) .Marland Kitchen.. 1 Cc Im Weekly X 3 Weeks Then Monthly 10)  Domperidome 10 Mg .... One By Mouth Three Times A Day Before Meals  Allergies (verified): No Known Drug Allergies  Past History:  Past medical, surgical, family and social histories (including risk factors) reviewed for relevance to current acute and chronic problems.  Past Medical History: Diabetes mellitus, type II (A1c 6.8% in 2004) Hyperlipidemia Hypertension GERD/ Barrett's, Dr Jarold Motto Nephrolithiasis, hx of X 3, Dr Vonita Moss Diverticulosis, colon Colonic polyps, hx of Gastroparesis  Past Surgical History: Reviewed history from 03/08/2009 and no changes required. Colectomy, partial for diverticulitis 1997; Endo : gastric polyp 2006,neg 2009 Elbow surgery Colon polypectomy 2009, Dr Jarold Motto  Family History: Reviewed history from 02/25/2008 and no changes required. Adopted  Social History: Reviewed history from 09/09/2008 and no changes required. 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  Alcohol Use - yes/ occ. Daily Caffeine Use-4 Illicit Drug Use - no Patient gets regular exercise.  Review of Systems       The patient  complains of back pain.  The patient denies allergy/sinus, anemia, anxiety-new, arthritis/joint pain, blood in urine, breast changes/lumps, change in vision, confusion, cough, coughing up blood, depression-new, fainting, fatigue, fever, headaches-new, hearing problems, heart murmur, heart rhythm changes, itching, menstrual pain, muscle pains/cramps, night sweats, nosebleeds, pregnancy symptoms, shortness of breath, skin rash, sleeping problems, sore throat, swelling of feet/legs, swollen lymph glands, thirst - excessive , urination - excessive , urination changes/pain, urine leakage, vision changes, and voice change.    Vital Signs:  Patient profile:   70 year old male Height:      64 inches Weight:      165.38 pounds BMI:     28.49 Pulse rate:   60 / minute Pulse rhythm:   regular BP sitting:   128 / 74  (left arm) Cuff size:   regular  Vitals Entered By: Christie Nottingham CMA [name]) (March 10, 2010 11:34 AM)  Physical Exam  General:  Well developed, well nourished, no acute distress.healthy appearing.  healthy appearing.   Head:  Normocephalic and atraumatic. Eyes:  PERRLA, no icterus. Chest Wall:  Symmetrical,  no deformities . Abdomen:  Soft, nontender and nondistended. No masses, hepatosplenomegaly or hernias noted. Normal bowel sounds. Psych:  Alert and cooperative. Normal mood and affect.   Impression & Recommendations:  Problem # 1:  LUQ PAIN (ICD-789.02) Assessment Unchanged His pain pattern seems pancreatic in nature, etiology unclear. I remain concerned for the possibility of occult pancreatic malignancy. I have ordered C. 19-9 pancreatic tumor marker and amylase and lipase. He is to try pancreatic extract replacement therapy for several weeks with followup at that time. We will continue Nexium but discontinue domperidone. He may need referral to surgery for consultation for possible intestinal adhesions related to his previous partial colectomy. Orders: TLB-Amylase  (82150-AMYL) TLB-Lipase (83690-LIPASE) T-CA 19-9 ([id number])  Problem # 2:  B12 DEFICIENCY (ICD-266.2) Assessment: Improved Continue parenteral replacement therapy.This could be associated with chronic pancreatitis.  Problem # 3:  COLONIC POLYPS, HX OF (ICD-V12.72) Assessment: Unchanged He is up-to-date on his colonoscopy exams.  Problem # 4:  BARRETTS ESOPHAGUS (ICD-530.85) Assessment: Unchanged Continue reflux maneuvers and daily Nexium but discontinue domperidone.  Patient Instructions: 1)  Please go to the basement for lab work.  2)  Begin Zenpep three times a day with meals. 3)  The medication list was reviewed and reconciled.  All changed / newly prescribed medications were explained.  A complete medication list was provided to the patient / caregiver. 4)  Copy sent to : Dr. Marga Melnick 5)  Please continue current medications.  6)  Please schedule a follow-up appointment in 3 weeks.   Appended Document: Follow up after CT scan/dfs    Clinical Lists Changes  Medications: Added new medication of ZENPEP 20000 UNIT CPEP (PANCRELIPASE (LIP-PROT-AMYL)) 1 by mouth three times a day with meals - Signed Rx of ZENPEP 20000 UNIT CPEP (PANCRELIPASE (LIP-PROT-AMYL)) 1 by mouth three times a day with meals;  #90 x 6;  Signed;  Entered by: Ashok Cordia RN;  Authorized by: Mardella Layman MD [facility];  Method used: Electronically to [facility] [address], [place], [place], [place]  [address], Ph: [phone], Fax: [phone]    Prescriptions: ZENPEP 20000 UNIT CPEP (PANCRELIPASE (LIP-PROT-AMYL)) 1  by mouth three times a day with meals  #90 x 6   Entered by:   Ashok Cordia RN   Authorized by:   Mardella Layman MD [facility]   Signed by:   Ashok Cordia RN on 03/10/2010   Method used:   Electronically to        [facility]       [address]       [place], [place]  [address]       Ph: [phone]       Fax:  [phone]   RxID:   [id number]

## 2010-10-31 NOTE — Assessment & Plan Note (Signed)
Summary: cpx/fasting/kn   Vital Signs:  Patient profile:   70 year old male Height:      65 inches Weight:      163.6 pounds Temp:     97.8 degrees F oral Pulse rate:   60 / minute Resp:     14 per minute BP sitting:   128 / 72  (left arm) Cuff size:   regular  Vitals Entered By: Shonna Chock CMA (May 03, 2010 11:02 AM) CC: CPX with fasting labs , Type 2 diabetes mellitus follow-up   Primary Care Dean Green:  Dean Melnick, MD  CC:  CPX with fasting labs  and Type 2 diabetes mellitus follow-up.  History of Present Illness: Dean Green is here for a physical; he is still working. He has Medicare for hospital coverage only. 1.Risk factors based on Past M, S, F history:Abdominal pain ( post meal; extensive evaluation reviewed); DM; HTN; Dyslipidemia 2.Physical Activities: see data 3.Depression/mood: none 4.Hearing:intact @ 6 ft to whisper  5.ADL's: no limitations 6.Fall Risk: none 7.Home Safety:no risks  8.Height, weight, &visual acuity:wall chart read @6  ft with lenses 9.Counseling:none requested  10.Labs ordered based on risk factors: see Orders 11. Referral Coordination: Ophth exam recommended 12. Care Plan: see Instructions 13. Cognitive Assessment: memory & recall  good; math ability excellent;no mood abnormality Hyperlipidemia Follow-Up      This is a 70 year old man who presents for Hyperlipidemia follow-up.The patient reports abdominal pain  ( see evaluation by Dean Green & Jacobs)and fatigue, but denies muscle aches, flushing, itching, constipation, and diarrhea.The patient denies the following symptoms: chest pain/pressure, exercise intolerance, dypsnea, palpitations, syncope, and pedal edema.Compliance with medications (by patient report) has been near 100%. Dietary compliance has been good.Adjunctive measures currently used by the patient include ASA, folic acid, and fish oil supplements.Lipids6/29  WNL Type 2 Diabetes Mellitus Follow-Up      The patient is also here  for Type 2 diabetes mellitus follow-up.The patient reports weight loss of 2-3 #, but denies polyuria, polydipsia, blurred vision, self managed hypoglycemia, and numbness of extremities.The patient denies the following symptoms: neuropathic pain, vomiting, orthostatic symptoms, poor wound healing, intermittent claudication, vision loss, and foot ulcer.The patient has been measuring capillary blood glucose before breakfast , < 125.  Since the last visit, the patient reports having had no eye care  but  foot care  for Plantar Fasciitis & fungal toenail infection.Lamisil Rxed but not taken  Preventive Screening-Counseling & Management  Alcohol-Tobacco     Alcohol drinks/day: <1     Smoking Status: quit > 6 months     Year Started: 1964     Year Quit: 1981     Cigars/week: 2-3  Caffeine-Diet-Exercise     Caffeine use/day: 3+     Diet Comments: no diet     Does Patient Exercise: no  Hep-HIV-STD-Contraception     Dental Visit-last 6 months yes     Sun Exposure-Excessive: no  Safety-Violence-Falls     Seat Belt Use: yes     Firearms in the Home: firearms in the home     Firearm Counseling: not indicated; uses recommended firearm safety measures     Smoke Detectors: yes     Violence in the Home: no risk noted     Sexual Abuse: no     Fall Risk: none      Sexual History:  currently monogamous.        Blood Transfusions:  no.  Travel History:  none in past.    Current Medications (verified): 1)  Pravastatin Sodium 40 Mg Tabs (Pravastatin Sodium) .... Take One Tablet Each Evening. **appointment Due** 2)  Actos 30 Mg  Tabs (Pioglitazone Hcl) .Marland Kitchen.. 1 By Mouth Qd 3)  Lisinopril 40 Mg  Tabs (Lisinopril) .Marland Kitchen.. 1 Once Daily 4)  Adult Aspirin Ec Low Strength 81 Mg  Tbec (Aspirin) .... Take One Tablet Daily 5)  Folic Acid 800 Mcg Tabs (Folic Acid) .Marland Kitchen.. 1 By Mouth Once Daily 6)  Multivitamins   Caps (Multiple Vitamin) .... Take One Tablet Daily 7)  Omeprazole 40 Mg  Cpdr (Omeprazole) .Marland Kitchen.. 1  By Mouth Two Times A Day 8)  Fish Oil 1200 Mg Caps (Omega-3 Fatty Acids) .Marland Kitchen.. 1 By Mouth Once Daily 9)  Cyanocobalamin 1000 Mcg/ml Inj Soln (Cyanocobalamin) .... Monthly Injections 10)  Zenpep 20000 Unit Cpep (Pancrelipase (Lip-Prot-Amyl)) .... 2 By Mouth Three Times A Day With Meals 11)  Tramadol Hcl 50 Mg Tabs (Tramadol Hcl) .... One Tablet By Mouth Every 6-8 Hours As Needed For Pain  Allergies (verified): No Known Drug Allergies  Past History:  Past Medical History: Diabetes mellitus, type II (A1c 6.8% in 2004) Hyperlipidemia Hypertension GERD with  Barrett's, Dean Green Nephrolithiasis, hx of X 3, Dean Green Diverticulosis, colon Colonic polyps, hx of Gastroparesis  Past Surgical History: Colectomy, partial for diverticulitis 1997; Endoscopy : gastric polyp 2006,neg 2009 Elbow surgery Colon polypectomy 2009, Dean Green  Family History: Adopted, no family history  Social History: No diet Occupation:IT Tax inspector) Patient is a former smoker.  Alcohol Use - yes/ occ. 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    Physical Exam  General:  well-nourished,in no acute distress; alert,appropriate and cooperative throughout examination Head:  Normocephalic and atraumatic without obvious abnormalities. Pattern  alopecia ; moustache & beard Eyes:  No corneal or conjunctival inflammation noted. Perrla. Funduscopic exam  benign, without hemorrhages, exudates or papilledema. Ears:  External ear exam shows no significant lesions or deformities.  Otoscopic examination reveals clear canals, tympanic membranes are intact bilaterally without bulging, retraction, inflammation or discharge. Hearing is grossly normal bilaterally. Nose:  External nasal examination shows no deformity or inflammation. Nasal mucosa are pink and moist without lesions or exudates. Mouth:  Oral mucosa and oropharynx without lesions or exudates.  Teeth in good repair. Neck:  No deformities, masses, or tenderness noted. Lungs:  Normal respiratory effort, chest expands symmetrically. Lungs are clear to auscultation, no crackles or wheezes. Heart:  Normal rate and regular rhythm. S1 and S2 normal without gallop, murmur, click, rub . Abdomen:  Bowel sounds positive,abdomen soft and non-tender without masses, organomegaly or hernias noted. Rectal:  No external abnormalities noted. Normal sphincter tone. No rectal masses or tenderness. Genitalia:  Testes bilaterally descended without nodularity, tenderness or masses. No scrotal masses or lesions. No penis lesions or urethral discharge. Prostate:  Prostate gland firm and smooth, no enlargement, nodularity, tenderness, mass, asymmetry or induration. Msk:  No deformity or scoliosis noted of thoracic  or lumbar spine.   Pulses:  R and L carotid,radial,dorsalis pedis and posterior tibial pulses are full and equal bilaterally Extremities:  No clubbing, cyanosis, edema, or deformity noted with normal full range of motion of all joints.  Deformed R great toenail  Neurologic:  alert & oriented X3 and DTRs symmetrical and normal.   Skin:  Intact without suspicious lesions or rashes Cervical Nodes:  No lymphadenopathy noted Axillary Nodes:  No palpable lymphadenopathy Inguinal Nodes:  No significant adenopathy Psych:  memory intact for recent and remote, normally interactive, and good eye contact.     Impression  & Recommendations:  Problem # 1:  ROUTINE GENERAL MEDICAL EXAM@HEALTH  CARE FACL (ICD-V70.0)  Orders: EKG w/ Interpretation (93[address]) Venipuncture ([address]) TLB-TSH (Thyroid Stimulating Hormone) (84443-TSH) TLB-A1C / Hgb A1C (Glycohemoglobin) (83036-A1C) TLB-Microalbumin/Creat Ratio, Urine (82043-MALB) TLB-PSA (Prostate Specific Antigen) (84153-PSA) Ophthalmology Referral (Ophthalmology)  Problem # 2:  HYPERTENSION (ICD-401.9) controlled His updated medication list for this problem includes:    Lisinopril 40 Mg Tabs (Lisinopril) .Marland Kitchen... 1 once daily  Problem # 3:  HYPERLIPIDEMIA (ICD-272.4) Lipids @ goal His updated medication list for this problem includes:    Pravastatin Sodium 40 Mg Tabs (Pravastatin sodium) .Marland Kitchen... Take one tablet each evening.  Problem # 4:  DIABETES MELLITUS, TYPE II (ICD-250.00)  His updated medication list for this problem includes:    Actos 30 Mg Tabs (Pioglitazone hcl) .Marland Kitchen... 1 by mouth qd    Lisinopril 40 Mg Tabs (Lisinopril) .Marland Kitchen... 1 once daily    Adult Aspirin Ec Low Strength 81 Mg Tbec (Aspirin) .Marland Kitchen... Take one tablet daily  Orders: Ophthalmology Referral (Ophthalmology)  Complete Medication List: 1)  Pravastatin Sodium 40 Mg Tabs (Pravastatin sodium) .... Take one tablet each evening. 2)  Actos 30 Mg Tabs (Pioglitazone hcl) .Marland Kitchen.. 1 by mouth qd 3)  Lisinopril 40 Mg Tabs (Lisinopril) .Marland Kitchen.. 1 once daily 4)  Adult Aspirin Ec Low Strength 81 Mg Tbec (Aspirin) .... Take one tablet daily 5)  Folic Acid 800 Mcg Tabs (Folic acid) .Marland Kitchen.. 1 by mouth once daily 6)  Multivitamins Caps (Multiple vitamin) .... Take one tablet daily 7)  Omeprazole 40 Mg Cpdr (Omeprazole) .Marland Kitchen.. 1 by mouth two times a day 8)  Fish Oil 1200 Mg Caps (Omega-3 fatty acids) .Marland Kitchen.. 1 by mouth once daily 9)  Cyanocobalamin 1000 Mcg/ml Inj Soln (Cyanocobalamin) .... Monthly injections 10)  Zenpep 20000 Unit Cpep (Pancrelipase (lip-prot-amyl)) .... 2 by mouth three times a day with meals 11)  Tramadol  Hcl 50 Mg Tabs (Tramadol hcl) .... One tablet by mouth every 6-8 hours as needed for pain  Other Orders: Pneumococcal Vaccine (60454) Admin 1st Vaccine (09811)  Patient Instructions: 1)  It is important that you exercise regularly at least 20 minutes 5 times a week. If you develop chest pain, have severe difficulty breathing, or feel very tired , stop exercising immediately and seek medical attention. 2)  Take an 81 mg coated  Aspirin every day. 3)  Check your blood sugars regularly. If your readings are usually above :150  or below 90 you should contact our office. 4)  See your eye doctor yearly to check for diabetic eye damage. 5)  Check your feet each night for sore areas, calluses or signs of infection. 6)  Check your Blood Pressure regularly. If it is above:  135/85 ON AVERAGE you should make an appointment. Prescriptions: PRAVASTATIN SODIUM 40 MG TABS (PRAVASTATIN SODIUM) TAKE ONE TABLET EACH EVENING.  #90 x 3   Entered and Authorized  by:   Dean Melnick MD   Signed by:   Dean Melnick MD on 05/03/2010   Method used:   Print then Give to Patient   RxID:   [id number]    Immunization History:  Tetanus/Td Immunization History:    Tetanus/Td:  historical (02/18/2006)  Immunizations Administered:  Pneumonia Vaccine:    Vaccine Type: Pneumovax (Medicare)    Site: right deltoid    Mfr: Merck    Dose: 0.5 ml    Route: IM    Given by: Shonna Chock CMA    Exp. Date: 10/18/2011    Lot #: [id number]    Appended Document: cpx/fasting/kn

## 2010-10-31 NOTE — Progress Notes (Signed)
Summary: refill  Phone Note Refill Request Message from:  Fax from Pharmacy on January 02, 2010 10:50 AM  Refills Requested: Medication #1:  ACTOS 30 MG  TABS 1 by mouth qd [facility] fax [phone]   Method Requested: Fax to Local Pharmacy Next Appointment Scheduled: no appt Initial call taken by: Barb Merino,  January 02, 2010 10:51 AM    Prescriptions: ACTOS 30 MG  TABS (PIOGLITAZONE HCL) 1 by mouth qd  #90 x 0   Entered by:   Shonna Chock   Authorized by:   Marga Melnick MD   Signed by:   Shonna Chock on 01/02/2010   Method used:   Faxed to ...       [facility]* (mail-order)             ,          Ph: [phone]       Fax: [phone]   RxID:   [id number]

## 2010-10-31 NOTE — Letter (Signed)
Summary: [facility]   Imported By: Lanelle Bal 06/13/2010 09:25:08  _____________________________________________________________________  External Attachment:    Type:   Image     Comment:   External Document

## 2010-10-31 NOTE — Letter (Signed)
Summary: EGD Instructions  [facility]  [address] [place], [place] [address]   Phone: [phone]  Fax: [phone]       Dean Green    02/24/1941    MRN: [id number]       Procedure Day Dorna Bloom:  Wednesday 01/11/2010     Arrival Time: 2:00 pm     Procedure Time: 3:00 pm     Location of Procedure:                    _ x _ [facility] (4th Floor)    PREPARATION FOR ENDOSCOPY   On Wednesday 4/13 THE DAY OF THE PROCEDURE:  1.   No solid foods, milk or milk products are allowed after midnight the night before your procedure.  2.   Do not drink anything colored red or purple.  Avoid juices with pulp.  No orange juice.  3.  You may drink clear liquids until 1:00 pm, which is 2 hours before your procedure.                                                                                                CLEAR LIQUIDS INCLUDE: Water Jello Ice Popsicles Tea (sugar ok, no milk/cream) Powdered fruit flavored drinks Coffee (sugar ok, no milk/cream) Gatorade Juice: apple, white grape, white cranberry  Lemonade Clear bullion, consomm, broth Carbonated beverages (any kind) Strained chicken noodle soup Hard Candy   MEDICATION INSTRUCTIONS  Unless otherwise instructed, you should take regular prescription medications with a small sip of water as early as possible the morning of your procedure.  Diabetic patients - see separate instructions.              OTHER INSTRUCTIONS  You will need a responsible adult at least 68 years of age to accompany you and drive you home.   This person must remain in the waiting room during your procedure.  Wear loose fitting clothing that is easily removed.  Leave jewelry and other valuables at home.  However, you may wish to bring a book to read or an iPod/MP3 player to listen to music as you wait for your procedure to start.  Remove all body piercing jewelry and leave at home.  Total time from sign-in until  discharge is approximately 2-3 hours.  You should go home directly after your procedure and rest.  You can resume normal activities the day after your procedure.  The day of your procedure you should not:   Drive   Make legal decisions   Operate machinery   Drink alcohol   Return to work  You will receive specific instructions about eating, activities and medications before you leave.    The above instructions have been reviewed and explained to me by   Ulis Rias RN  January 05, 2010 8:35 AM     I fully understand and can verbalize these instructions _____________________________ Date _________

## 2010-10-31 NOTE — Miscellaneous (Signed)
Summary: pamine forte-rx  Clinical Lists Changes  Medications: Added new medication of PAMINE FORTE 5 MG TABS (METHSCOPOLAMINE BROMIDE) p.o two times a day - Signed Rx of PAMINE FORTE 5 MG TABS (METHSCOPOLAMINE BROMIDE) p.o two times a day;  #60 x 6;  Signed;  Entered by: Greer Ee RN;  Authorized by: Mardella Layman MD [facility];  Method used: Electronically to [facility] [address], [place], [place], [place]  [address], Ph: [phone], Fax: [phone]    Prescriptions: PAMINE FORTE 5 MG TABS (METHSCOPOLAMINE BROMIDE) p.o two times a day  #60 x 6   Entered by:   Greer Ee RN   Authorized by:   Mardella Layman MD [facility]   Signed by:   Greer Ee RN on 01/11/2010   Method used:   Electronically to        [facility]       [address]       [place], [place]  [address]       Ph: [phone]       Fax: [phone]   RxID:   [id number]

## 2010-10-31 NOTE — Procedures (Signed)
Summary: Endoscopic Ultrasound  Patient: Dean Green Note: All result statuses are Final unless otherwise noted.  Tests: (1) Endoscopic Ultrasound (EUS)  EUS Endoscopic Ultrasound                             DONE     [facility]     [address] [place], [place]  [address]           ENDOSCOPIC ULTRASOUND PROCEDURE REPORT     PATIENT:  Dean Green, Dean Green  MR#:  [id number]     BIRTHDATE:  April 10, 1941  GENDER:  male     ENDOSCOPIST:  Rachael Fee, MD     REFERRED BY:  Vania Rea. Jarold Motto, M.D.     PROCEDURE DATE:  04/27/2010     PROCEDURE:  Upper EUS     ASA CLASS:  Class II     INDICATIONS:  chronic intermittent abdominal pain     MEDICATIONS:   Fentanyl 75 mcg IV, Versed 7.5 mg IV     DESCRIPTION OF PROCEDURE:   After the risks benefits and     alternatives of the procedure were  explained, informed consent     was obtained. The patient was then placed in the left, lateral,     decubitus postion and IV sedation was administered. Throughout the     procedure, the patient's blood pressure, pulse and oxygen     saturations were monitored continuously.  Under direct     visualization, the  endoscope was introduced through the  and     advanced to the .  Water was used as necessary to provide an     acoustic interface.  Upon completion of the imaging, water was     removed and the patient was sent to the recovery room in     satisfactory condition.     <<PROCEDUREIMAGES>>           Endoscopic findings:     1. Normal esophagus.     2. Small hiatal heria, otherwise normal stomach.     3. Normal visualized duodenum.           EUS findings:     1. Pancreatic parenchyma was normal throughout; no discrete     masses, no signs of chronic pancreatitis.     2. Main pancreatic duct was normal; non-dilated, normal appearing     walls.     3. Gallbladder contained a 4-14mm, non-shadowing, soft tissue     lesion along one wall;  this was likely a polyp.     4. CBD was normal,  non-dilated.     5. No peripancreatic, celiac adenopathy.     6. Limited views of liver, spleen, portal and splenic vessels were     all normal.           Impression:     No parenchymal or ductal signs of chronic pancreatitis.  There was     a small lesion in gallbladder that is probably a polyp (4-65mm).     This is of doubtful clinical signficance.   His intermittent     abdominal pains may be adhesive related given previous abdominal     surgeries.  I will communicate these findings with Dr. Jarold Motto.           ______________________________     Rachael Fee, MD  n.     eSIGNED:   Rachael Fee at 04/27/2010 11:09 AM           Vena Rua, [id number]  Note: An exclamation mark (!) indicates a result that was not dispersed into the flowsheet. Document Creation Date: 04/27/2010 11:09 AM _______________________________________________________________________  (1) Order result status: Final Collection or observation date-time: 04/27/2010 11:01 Requested date-time:  Receipt date-time:  Reported date-time:  Referring Physician:   Ordering Physician: Rob Bunting [phone]) Specimen Source:  Source: Launa Grill Order Number: [id number] Lab site:

## 2010-10-31 NOTE — Assessment & Plan Note (Signed)
Summary: MONTHLY B12 SHOT...LSW.  Nurse Visit   Allergies: No Known Drug Allergies  Medication Administration  Injection # 1:    Medication: Vit B12 1000 mcg    Diagnosis: B12 DEFICIENCY (ICD-266.2)    Route: IM    Site: R deltoid    Exp Date: 10/13    Lot #: [id number]    Mfr: American Regent    Patient tolerated injection without complications    Given by: Milford Cage NCMA (September 05, 2010 2:56 PM)  Orders Added: 1)  Vit B12 1000 mcg [J3420]

## 2010-10-31 NOTE — Progress Notes (Signed)
Summary: LAB RESULTS  Phone Note Call from Patient Call back at Home Phone [phone]   Call For: DR PATTERSON Reason for Call: Lab or Test Results Initial call taken by: Leanor Kail [facility],  March 16, 2010 3:48 PM  Follow-up for Phone Call        Pt notified of lab results. Follow-up by: Ashok Cordia RN,  March 16, 2010 3:59 PM

## 2010-10-31 NOTE — Progress Notes (Signed)
Summary: triage  Phone Note Call from Patient Call back at Home Phone [phone]   Caller: Patient Call For: Jarold Motto Reason for Call: Talk to Nurse Summary of Call: Patient wants to speak to nurse regarding problems he is still having and meds not working. Initial call taken by: Tawni Levy,  March 01, 2010 12:13 PM  Follow-up for Phone Call        Pt cont's to have pain in upper left side.  Has been on the domperidome and can see no difference in the pain. Seee phone note from 01/24/10.   Pain is starting to interfere with his life style.   Pt states he had 10 inches if his colon removed on the left side and wonders if this could be adhesions.  Would like to know what else he can do to find out what is going on. Follow-up by: Ashok Cordia RN,  March 01, 2010 12:30 PM  Additional Follow-up for Phone Call Additional follow up Details #1::        CT scan needed... Additional Follow-up by: Mardella Layman MD Clementeen Graham,  March 01, 2010 1:14 PM  New Problems: LUQ PAIN (ICD-789.02)   Additional Follow-up for Phone Call Additional follow up Details #2::    Pt notified.  will sch and call pt back with appt time...  Lupita Leash Surface RN  March 01, 2010 2:12 PM  Appt sch for CT scan at [facility] on June 8 at 10:00.  Pt needs to come by for lab, BUN and Creat and he needs to pick up contrast.    Ashok Cordia RN  March 01, 2010 3:08 PM  Pt notified.  has appt on 03/03/10 for B12 inj, he will pick up contrast and have labs drawn then. Follow-up by: Ashok Cordia RN,  March 01, 2010 3:15 PM  New Problems: LUQ PAIN (ICD-789.02)

## 2010-10-31 NOTE — Assessment & Plan Note (Signed)
Summary: B12 SHOT...LSW.  Nurse Visit   Allergies: No Known Drug Allergies  Medication Administration  Injection # 1:    Medication: Vit B12 1000 mcg    Diagnosis: B12 DEFICIENCY (ICD-266.2)    Route: IM    Site: R deltoid    Exp Date: 11/2011    Lot #: [id number]    Mfr: American Regent    Comments: pt to schedule next monthly b12 at front desk    Patient tolerated injection without complications    Given by: Chales Abrahams CMA [name]) (March 30, 2010 10:46 AM)  Orders Added: 1)  Vit B12 1000 mcg [J3420]

## 2010-10-31 NOTE — Miscellaneous (Signed)
Summary: clotest  Clinical Lists Changes  Orders: Added new Test order of TLB-H Pylori Screen Gastric Biopsy (83013-CLOTEST) - Signed 

## 2010-10-31 NOTE — Assessment & Plan Note (Signed)
Summary: @2  of 3 weekly B12/266.2/dfs  Nurse Visit   Allergies: No Known Drug Allergies  Medication Administration  Injection # 1:    Medication: Vit B12 1000 mcg    Diagnosis: B12 DEFICIENCY (ICD-266.2)    Route: IM    Site: L deltoid    Exp Date: 08/2011    Lot #: [id number]    Mfr: American Regent    Comments: pt to schedule # 3 of 3 weekly at front desk    Patient tolerated injection without complications    Given by: Chales Abrahams CMA [name]) (December 30, 2009 4:31 PM)  Orders Added: 1)  Vit B12 1000 mcg [J3420]

## 2010-10-31 NOTE — Miscellaneous (Signed)
Summary: Lec previsit  Clinical Lists Changes  Observations: Added new observation of NKA: T (01/05/2010 8:21)

## 2010-10-31 NOTE — Letter (Signed)
Summary: EGD Instructions  [facility]  [address] [place], [place] [address]   Phone: [phone]  Fax: [phone]       Dean Green    October 11, 1940    MRN: [id number]       Procedure Day /Date:04/27/10  Dean Green     Arrival Time: 10 am     Procedure Time:11 am     Location of Procedure:                     [facility] ( Outpatient Registration)    PREPARATION FOR ENDOSCOPY   On 04/27/10  THE DAY OF THE PROCEDURE:  1.   No solid foods, milk or milk products are allowed after midnight the night before your procedure.  2.   Do not drink anything colored red or purple.  Avoid juices with pulp.  No orange juice.  3.  You may drink clear liquids until 7 am  which is 4 hours before your procedure.                                                                                                CLEAR LIQUIDS INCLUDE: Water Jello Ice Popsicles Tea (sugar ok, no milk/cream) Powdered fruit flavored drinks Coffee (sugar ok, no milk/cream) Gatorade Juice: apple, white grape, white cranberry  Lemonade Clear bullion, consomm, broth Carbonated beverages (any kind) Strained chicken noodle soup Hard Candy   MEDICATION INSTRUCTIONS  Unless otherwise instructed, you should take regular prescription medications with a small sip of water as early as possible the morning of your procedure.  Diabetic patients - see separate instructions.               OTHER INSTRUCTIONS  You will need a responsible adult at least 68 years of age to accompany you and drive you home.   This person must remain in the waiting room during your procedure.  Wear loose fitting clothing that is easily removed.  Leave jewelry and other valuables at home.  However, you may wish to bring a book to read or an iPod/MP3 player to listen to music as you wait for your procedure to start.  Remove all body piercing jewelry and leave at home.  Total time from sign-in until discharge  is approximately 2-3 hours.  You should go home directly after your procedure and rest.  You can resume normal activities the day after your procedure.  The day of your procedure you should not:   Drive   Make legal decisions   Operate machinery   Drink alcohol   Return to work  You will receive specific instructions about eating, activities and medications before you leave.    The above instructions have been reviewed and explained to me by   Chales Abrahams CMA [name])  April 12, 2010 2:57 PM     I fully understand and can verbalize these instructions over the phone mailed to home Date 04/12/10

## 2010-10-31 NOTE — Progress Notes (Signed)
Summary: results  Phone Note Call from Patient Call back at Home Phone [phone]   Caller: Patient Call For: Jarold Motto Reason for Call: Talk to Nurse, Lab or Test Results Summary of Call: Patient would like gastric emptying scan results, states that he still not feeling well. Initial call taken by: Tawni Levy,  January 24, 2010 4:41 PM  Follow-up for Phone Call        Discussed results with pt.   States he is having more frequent spells of pain.  Pain is just under rib cage on the left side.  Usually starts after eating.  Spells are now happening no matter what he eats and lasting longer.  Asking if this ia form the delayed emptying of his stomach or if something could be wrong with his gallbladder even though [place] did not show stones.  Pt to stop by tomorrow to pick up domperidone rx. Follow-up by: Ashok Cordia RN,  January 24, 2010 4:57 PM  Additional Follow-up for Phone Call Additional follow up Details #1::        Try tis first Additional Follow-up by: Mardella Layman MD FACG,  January 25, 2010 8:22 AM    Additional Follow-up for Phone Call Additional follow up Details #2::    Pt notified.  Rx and information on ordering Domperidone given to pt.  Pt isntructed to call back if worsens. Follow-up by: Ashok Cordia RN,  January 25, 2010 12:52 PM

## 2010-10-31 NOTE — Assessment & Plan Note (Signed)
Summary: PROCEDURE F-UP/YF   History of Present Illness Primary GI MD: Sheryn Bison MD FACP FAGA Primary Provider: Marga Melnick, MD Requesting Provider: n/a Chief Complaint: f/u from startng on Zenpep. Pt states the medicine worked really well x 2 weeks. Pt had a episode after he ate of abd pain. Since then he has increased to 2 tablets po with meals with some improvement but pt still has pain after meals.  History of Present Illness:   70 year old Caucasian male that I have followed for many years for recurrent diverticulitis, and he has had partial sigmoid resection many years ago and without sequelae. He also has chronic GERD, IBS, and B12 deficiency.  For the last 6 months Taesean has had constant left upper quadrant pain radiating to his back worsening with eating and refractory to medical therapy with PPi,domperidone for delayed gastric emptying, Carafate, and pancreatic extract. Ultrasound of the abdomen is benign unremarkable as has endoscopy and colonoscopy. C 19-9 tumor marker also has been normal as have liver enzymes, amylase and lipase. Patient gives no history of ethanol abuse or family history of chronic pancreatitis.  He initially had a good response to pancreatic extract one tablet p.o. t.i.d. His pain is now returned and we have increased his Zenpep enzyme therapy to 2 t.i.d. Also the last 2 years the patient developed adult onset diabetes which is managed by Dr. Marga Melnick. To my knowledge he has no problems with peripheral neuropathy, visual or renal complications from his diabetes. His vital above complaints his appetite is good and his weight is been stable. He did have removal of benign colon polyp in 2009.   GI Review of Systems    Reports abdominal pain.     Location of  Abdominal pain: left side.    Denies acid reflux, belching, bloating, chest pain, dysphagia with liquids, dysphagia with solids, heartburn, loss of appetite, nausea, vomiting, vomiting blood,  weight loss, and  weight gain.        Denies anal fissure, black tarry stools, change in bowel habit, constipation, diarrhea, diverticulosis, fecal incontinence, heme positive stool, hemorrhoids, irritable bowel syndrome, jaundice, light color stool, liver problems, rectal bleeding, and  rectal pain.    Current Medications (verified): 1)  Pravastatin Sodium 40 Mg Tabs (Pravastatin Sodium) .... Take One Tablet Each Evening. 2)  Actos 30 Mg  Tabs (Pioglitazone Hcl) .Marland Kitchen.. 1 By Mouth Qd 3)  Lisinopril 40 Mg  Tabs (Lisinopril) .Marland Kitchen.. 1 Once Daily 4)  Adult Aspirin Ec Low Strength 81 Mg  Tbec (Aspirin) .... Take One Tablet Daily 5)  Folic Acid 800 Mcg Tabs (Folic Acid) .Marland Kitchen.. 1 By Mouth Once Daily 6)  Multivitamins   Caps (Multiple Vitamin) .... Take One Tablet Daily 7)  Omeprazole 40 Mg  Cpdr (Omeprazole) .Marland Kitchen.. 1 By Mouth Two Times A Day 8)  Fish Oil 1200 Mg Caps (Omega-3 Fatty Acids) .Marland Kitchen.. 1 By Mouth Once Daily 9)  Cyanocobalamin 1000 Mcg/ml Inj Soln (Cyanocobalamin) .... Monthly Injections 10)  Zenpep 20000 Unit Cpep (Pancrelipase (Lip-Prot-Amyl)) .... 2 By Mouth Three Times A Day With Meals  Allergies (verified): No Known Drug Allergies  Past History:  Past medical, surgical, family and social histories (including risk factors) reviewed for relevance to current acute and chronic problems.  Past Medical History: Reviewed history from 03/10/2010 and no changes required. Diabetes mellitus, type II (A1c 6.8% in 2004) Hyperlipidemia Hypertension GERD/ Barrett's, Dr Jarold Motto Nephrolithiasis, hx of X 3, Dr Vonita Moss Diverticulosis, colon Colonic polyps, hx of Gastroparesis  Past  Surgical History: Reviewed history from 03/08/2009 and no changes required. Colectomy, partial for diverticulitis 1997; Endo : gastric polyp 2006,neg 2009 Elbow surgery Colon polypectomy 2009, Dr Jarold Motto  Family History: Reviewed history from 02/25/2008 and no changes required. Adopted  Social  History: Reviewed history from 09/09/2008 and no changes required. 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  Alcohol Use - yes/ occ. Daily Caffeine Use-4 Illicit Drug Use - no Patient gets regular exercise.  Review of Systems       The patient complains of fatigue.  The patient denies allergy/sinus, anemia, anxiety-new, arthritis/joint pain, back pain, blood in urine, breast changes/lumps, change in vision, confusion, cough, coughing up blood, depression-new, fainting, fever, headaches-new, hearing problems, heart murmur, heart rhythm changes, itching, menstrual pain, muscle pains/cramps, night sweats, nosebleeds, pregnancy symptoms, shortness of breath, skin rash, sleeping problems, sore throat, swelling of feet/legs, swollen lymph glands, thirst - excessive , urination - excessive , urination changes/pain, urine leakage, vision changes, and voice change.    Vital Signs:  Patient profile:   70 year old male Height:      64 inches Weight:      166.13 pounds BMI:     28.62 Pulse rate:   60 / minute Pulse rhythm:   regular BP sitting:   114 / 58  (right arm) Cuff size:   regular  Vitals Entered By: Christie Nottingham CMA [name]) (April 04, 2010 1:58 PM)  Physical Exam  General:  Well developed, well nourished, no acute distress.healthy appearing.   Head:  Normocephalic and atraumatic. Eyes:  PERRLA, no icterus.exam deferred to patient's ophthalmologist.   Abdomen:  Soft, nontender and nondistended. No masses, hepatosplenomegaly or hernias noted. Normal bowel sounds. Extremities:  No clubbing, cyanosis, edema or deformities noted. Neurologic:  Alert and  oriented x4;  grossly normal neurologically. Psych:  Alert and cooperative. Normal mood and affect.   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 I have scheduled him for endoscopic ultrasonography with Dr. Christella Hartigan. If this is negative, the patient has agreed that he can live with this situation. He has rather marked anxiety about this problem, and I feel that we need to continue to investigate possible etiologies. I have given him some p.r.n. tramadol 50 mg use as needed in the interim. I also person to take his Zenpep 2 tablets t.i.d. with meals, continue omeprazole, Actos, and B12 injections.  Problem # 2:  IBS (ICD-564.1) Assessment: Improved High Fiber Diet as tolerated.  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  Patient Instructions: 1)  Patty will contact you to schedule the Endoscopic Ultrasound. 2)  Pick your prescription from your pharmacy. 3)  Endoscopic Ultrasound handout given.  4)  The medication list was reviewed and reconciled.  All changed / newly prescribed medications were explained.  A complete medication list was provided to the patient / caregiver. 5)  Copy sent to : Dr. Marga Melnick and Dr. Rob Bunting Prescriptions: TRAMADOL HCL 50 MG TABS (TRAMADOL HCL) one tablet by mouth every 6-8 hours as needed for pain  #65 x 1  Entered by:   Ashok Cordia RN   Authorized by:   Mardella Layman MD [facility]   Signed by:   Ashok Cordia RN on 04/04/2010   Method used:   Electronically to        [facility] (retail)       [address]       [place], [place]  [address]       Ph: [phone]       Fax: [phone]   RxID:   [id number]

## 2010-10-31 NOTE — Progress Notes (Signed)
Summary: Triage  Phone Note Call from Patient Call back at Home Phone [phone]   Caller: Patient Call For: Dr. Jarold Motto Reason for Call: Talk to Nurse Summary of Call: pt would like to discuss his Zenpap...worked great for the first 2 wks. Now symptoms have returned Initial call taken by: Karna Christmas,  March 28, 2010 9:48 AM  Follow-up for Phone Call        Pt states the Zenpep worked great for the first 2 weeks that he took the med.  Had no pain at all during this time.  Started about 4 days ago with the pain agian.  Asks if this is normal?  Does he ned to increase the amt of zenpep that he is taking?  Currently taking 1 three times a day. Follow-up by: Ashok Cordia RN,  March 28, 2010 12:52 PM  Additional Follow-up for Phone Call Additional follow up Details #1::        take 2 by mouth three times a day..this is ok.... Additional Follow-up by: Mardella Layman MD FACG,  March 28, 2010 2:25 PM    Additional Follow-up for Phone Call Additional follow up Details #2::    Pt notified.  Has return OV. Follow-up by: Ashok Cordia RN,  March 28, 2010 2:37 PM

## 2010-10-31 NOTE — Procedures (Signed)
Summary: Upper Endoscopy  Patient: Dean Green Note: All result statuses are Final unless otherwise noted.  Tests: (1) Upper Endoscopy (EGD)   EGD Upper Endoscopy       DONE      [facility]     [address]     [place], [place]  [address]           ENDOSCOPY PROCEDURE REPORT           PATIENT:  Dean Green, Dean Green  MR#:  [id number]     BIRTHDATE:  [birth year], 68 yrs. old  GENDER:  male           ENDOSCOPIST:  Vania Rea. Jarold Motto, MD, [facility]     Referred by:           PROCEDURE DATE:  01/11/2010     PROCEDURE:  EGD with biopsy     ASA CLASS:  Class II     INDICATIONS:  abdominal pain, abdominal pain despite treatment     LABS AND ULTRASOUND EXAM ARE NORMAL.           MEDICATIONS:   Fentanyl 25 mcg IV, Versed 5 mg IV, glycopyrrolate     (Robinal) 0.2 mg IV     TOPICAL ANESTHETIC:  Exactacain Spray           DESCRIPTION OF PROCEDURE:   After the risks benefits and     alternatives of the procedure were thoroughly explained, informed     consent was obtained.  The [facility] [id number] endoscope was     introduced through the mouth and advanced to the second portion of     the duodenum, without limitations.  The instrument was slowly     withdrawn as the mucosa was fully examined.     <<PROCEDUREIMAGES>>           A hiatal hernia was found. 3-4 CM. HH NOTED.NO ESOPHAGITIS NOTED.     Normal GE junction was noted.  Normal duodenal folds were noted.     The stomach was entered and closely examined. The antrum,     angularis, and lesser curvature were well visualized, including a     retroflexed view of the cardia and fundus. The stomach wall was     normally distensable. The scope passed easily through the pylorus     into the duodenum. CLO BX. DONE.    Retroflexed views revealed a     hiatal hernia.    The scope was then withdrawn from the patient     and the procedure completed.           COMPLICATIONS:  None           ENDOSCOPIC IMPRESSION:     1) Hiatal hernia     2)  Normal GE junction     3) Normal duodenal folds     4) Normal stomach     RECURRENT ABDOMINAL [PAIN ?? ETIOLOGY.PROBABLE IBS AND HX. OF     CHRONIC DIVERTICULOSIS.     RECOMMENDATIONS:     1) continue PPI     TRIAL OF PAMINE FORTE 5 MG. BID.#60.REFILL X6.           REPEAT EXAM:  No           ______________________________     Vania Rea. Jarold Motto, MD, [facility] Dean Green           CC:  Pecola Lawless, MD           n.  eSIGNED:   Vania Rea. Patterson at 01/11/2010 03:09 PM           Vena Rua, [id number]  Note: An exclamation mark (!) indicates a result that was not dispersed into the flowsheet. Document Creation Date: 01/11/2010 3:09 PM _______________________________________________________________________  (1) Order result status: Final Collection or observation date-time: 01/11/2010 15:00 Requested date-time:  Receipt date-time:  Reported date-time:  Referring Physician:   Ordering Physician: Sheryn Bison [id number]) Specimen Source:  Source: Launa Grill Order Number: [id number] Lab site:

## 2010-10-31 NOTE — Letter (Signed)
Summary: Diabetic Instructions  [facility]  [address] [place], [place] [address]   Phone: [phone]  Fax: [phone]    VERE DIANTONIO 1941/04/28 MRN: [id number]   _  _   ORAL DIABETIC MEDICATION INSTRUCTIONS  The day before your procedure:   Take your diabetic pill as you do normally  The day of your procedure:   Do not take your diabetic pill    We will check your blood sugar levels during the admission process and again in Recovery before discharging you home  ________________________________________________________________________  _  _   INSULIN (LONG ACTING) MEDICATION INSTRUCTIONS (Lantus, NPH, 70/30, Humulin, Novolin-N)   The day before your procedure:   Take  your regular evening dose    The day of your procedure:   Do not take your morning dose    _  _   INSULIN (SHORT ACTING) MEDICATION INSTRUCTIONS (Regular, Humulog, Novolog)   The day before your procedure:   Do not take your evening dose   The day of your procedure:   Do not take your morning dose   _  _   INSULIN PUMP MEDICATION INSTRUCTIONS  We will contact the physician managing your diabetic care for written dosage instructions for the day before your procedure and the day of your procedure.  Once we have received the instructions, we will contact you.

## 2010-10-31 NOTE — Assessment & Plan Note (Signed)
Summary: Monthly B12/jmc  Nurse Visit   Allergies: No Known Drug Allergies  Medication Administration  Injection # 1:    Medication: Vit B12 1000 mcg    Diagnosis: B12 DEFICIENCY (ICD-266.2)    Route: IM    Site: R deltoid    Exp Date: 12/12    Lot #: [id number]    Mfr: American Regent    Patient tolerated injection without complications    Given by: Milford Cage NCMA (Feb 03, 2010 4:32 PM)  Orders Added: 1)  Vit B12 1000 mcg [J3420]

## 2010-11-02 NOTE — Progress Notes (Signed)
Summary: order labs  Phone Note Call from Patient Call back at Home Phone [phone]   Caller: Patient Call For: Dr Jarold Motto Reason for Call: Talk to Nurse Summary of Call: Patient wants to know if Dr Jarold Motto can order labs for Crohns desease Initial call taken by: Tawni Levy,  October 25, 2010 11:57 AM  Follow-up for Phone Call        Left a message on patients machine to call back. pt needs [facility] and then an office visit.Marland KitchenMarland KitchenMarland KitchenPer Dr. Jarold Motto Follow-up by: Harlow Mares CMA [name]),  October 25, 2010 12:02 PM  Additional Follow-up for Phone Call Additional follow up Details #1::        wife aware and they will come to the 3rd follow and sign the waiverfor the testing and get the order.  Additional Follow-up by: Harlow Mares CMA [name]),  October 25, 2010 12:09 PM

## 2010-11-02 NOTE — Letter (Signed)
Summary: [facility]   Imported By: Lanelle Bal 10/04/2010 11:57:28  _____________________________________________________________________  External Attachment:    Type:   Image     Comment:   External Document

## 2010-11-02 NOTE — Consult Note (Signed)
Summary: [facility]   Imported By: Lanelle Bal 09/13/2010 14:49:52  _____________________________________________________________________  External Attachment:    Type:   Image     Comment:   External Document

## 2010-11-02 NOTE — Assessment & Plan Note (Signed)
Summary: MONTHLY B12 SHOT...LSW.  Nurse Visit   Allergies: No Known Drug Allergies  Medication Administration  Injection # 1:    Medication: Vit B12 1000 mcg    Diagnosis: B12 DEFICIENCY (ICD-266.2)    Route: IM    Site: R deltoid    Exp Date: 07/2012    Lot #: [id number]    Mfr: American Regent    Patient tolerated injection without complications    Given by: Selinda Michaels RN (October 06, 2010 1:49 PM)  Orders Added: 1)  Vit B12 1000 mcg [J3420]

## 2010-11-08 NOTE — Progress Notes (Signed)
Summary: RX's   Phone Note Refill Request Call back at Home Phone [phone] Message from:  Patient on October 31, 2010 4:34 PM  Refills Requested: Medication #1:  ACTOS 30 MG  TABS 1 by mouth qd   Notes: #90 Patient called noting that he will be losing his insurance and he needs some prescriptions changed. Left message on voicemail to call back to office with the name of meds.  Initial call taken by: Lucious Groves CMA,  October 31, 2010 4:34 PM  Follow-up for Phone Call        Patient currently taking Lisinopril 40mg  1 by mouth once daily, would like Lisinopril 20mg  two times a day (180) because it will be cheaper.  Dr.Hopper please advise  **Patient will  be in [place] this afternoon and would like RX's placed up front for pick-up and a call so he can come by** Follow-up by: Shonna Chock CMA,  November 01, 2010 11:20 AM  Additional Follow-up for Phone Call Additional follow up Details #1::        Patient aware rx's avaliable for pick-up, 1 is under Dr.Paz name because Dr.Hopper only worked 1/2 day today and both rx's not printed when Dr.Hopper was here Additional Follow-up by: Shonna Chock CMA,  November 01, 2010 1:40 PM    New/Updated Medications: * LISINOPRIL 20 MG  TABS (LISINOPRIL) 1 two times a day Prescriptions: ACTOS 30 MG  TABS (PIOGLITAZONE HCL) 1 by mouth qd  #90 x 0   Entered by:   Shonna Chock CMA   Authorized by:   Nolon Rod. Paz MD   Signed by:   Shonna Chock CMA on 11/01/2010   Method used:   Print then Give to Patient   RxID:   [id number] ACTOS 30 MG  TABS (PIOGLITAZONE HCL) 1 by mouth qd  #90 Tablet x 0   Entered and Authorized by:   Shonna Chock CMA   Signed by:   Shonna Chock CMA on 11/01/2010   Method used:   Print then Give to Patient   RxID:   [id number] LISINOPRIL 20 MG  TABS (LISINOPRIL) 1 two times a day  #180 x 1   Entered and Authorized by:   Marga Melnick MD   Signed by:   Marga Melnick MD on 11/01/2010   Method used:   Print  then Give to Patient   RxID:   [id number]

## 2010-11-14 ENCOUNTER — Telehealth: Payer: Self-pay | Admitting: Internal Medicine

## 2010-11-22 NOTE — Progress Notes (Signed)
Summary: Refill--Simvastatin  Phone Note Refill Request Message from:  Patient on November 14, 2010 3:03 PM  Refills Requested: Medication #1:  PRAVASTATIN SODIUM 40 MG TABS TAKE ONE TABLET EACH EVENING. Patient called noting that he needs 90 day prescription for the above. He was made aware that his lipids were last checkin in 03/2009. He notes that he is trying to get Medicare straightened out, so he has not seen [place]Please advise.  Initial call taken by: Lucious Groves CMA,  November 14, 2010 3:04 PM  Follow-up for Phone Call        give him #30; he must schedule with MD there ASAP . It is essential to monitor lipids & liver function Follow-up by: Marga Melnick MD,  November 14, 2010 4:54 PM  Additional Follow-up for Phone Call Additional follow up Details #1::        Patient notified and states that he comes to [facility] frequently so he wants [facility] to continue being his PCP until he is not her frequently. Lucious Groves CMA  November 14, 2010 4:57 PM     Prescriptions: PRAVASTATIN SODIUM 40 MG TABS (PRAVASTATIN SODIUM) TAKE ONE TABLET EACH EVENING.  #30 x 0   Entered by:   Lucious Groves CMA   Authorized by:   Marga Melnick MD   Signed by:   Lucious Groves CMA on 11/14/2010   Method used:   Print then Give to Patient   RxID:   [id number]

## 2010-11-29 ENCOUNTER — Encounter: Payer: Self-pay | Admitting: Internal Medicine

## 2010-12-07 NOTE — Letter (Signed)
Summary: [facility]   Imported By: Maryln Gottron 11/28/2010 12:52:00  _____________________________________________________________________  External Attachment:    Type:   Image     Comment:   External Document

## 2010-12-12 LAB — BASIC METABOLIC PANEL
BUN: 6 mg/dL (ref 6–23)
BUN: 8 mg/dL (ref 6–23)
CO2: 29 mEq/L (ref 19–32)
CO2: 31 mEq/L (ref 19–32)
Calcium: 7.9 mg/dL — ABNORMAL LOW (ref 8.4–10.5)
Calcium: 8.6 mg/dL (ref 8.4–10.5)
Chloride: 99 mEq/L (ref 96–112)
Creatinine, Ser: 1.03 mg/dL (ref 0.4–1.5)
Creatinine, Ser: 1.06 mg/dL (ref 0.4–1.5)
GFR calc Af Amer: >60 mL/min (ref >60)
GFR calc Af Amer: >60 mL/min (ref >60)
Glucose, Bld: 103 mg/dL — ABNORMAL HIGH (ref 70–99)
Glucose, Bld: 128 mg/dL — ABNORMAL HIGH (ref 70–99)
Potassium: 4.6 mEq/L (ref 3.5–5.1)
Sodium: 135 mEq/L (ref 135–145)

## 2010-12-12 LAB — GLUCOSE, CAPILLARY
Glucose-Capillary: 111 mg/dL — ABNORMAL HIGH (ref 70–99)
Glucose-Capillary: 115 mg/dL — ABNORMAL HIGH (ref 70–99)
Glucose-Capillary: 117 mg/dL — ABNORMAL HIGH (ref 70–99)
Glucose-Capillary: 119 mg/dL — ABNORMAL HIGH (ref 70–99)
Glucose-Capillary: 120 mg/dL — ABNORMAL HIGH (ref 70–99)
Glucose-Capillary: 123 mg/dL — ABNORMAL HIGH (ref 70–99)
Glucose-Capillary: 125 mg/dL — ABNORMAL HIGH (ref 70–99)
Glucose-Capillary: 128 mg/dL — ABNORMAL HIGH (ref 70–99)
Glucose-Capillary: 129 mg/dL — ABNORMAL HIGH (ref 70–99)
Glucose-Capillary: 143 mg/dL — ABNORMAL HIGH (ref 70–99)
Glucose-Capillary: 169 mg/dL — ABNORMAL HIGH (ref 70–99)
Glucose-Capillary: 98 mg/dL (ref 70–99)

## 2010-12-12 LAB — DIFFERENTIAL
Basophils Absolute: 0 10*3/uL (ref 0.0–0.1)
Eosinophils Absolute: 0.1 10*3/uL (ref 0.0–0.7)
Eosinophils Relative: 1 % (ref 0–5)
Lymphs Abs: 1.6 10*3/uL (ref 0.7–4.0)
Monocytes Absolute: 0.9 10*3/uL (ref 0.1–1.0)

## 2010-12-12 LAB — CBC
Hemoglobin: 12.6 g/dL — ABNORMAL LOW (ref 13.0–17.0)
MCH: 31 pg (ref 26.0–34.0)
MCH: 31.5 pg (ref 26.0–34.0)
MCHC: 33.4 g/dL (ref 30.0–36.0)
MCV: 90.5 fL (ref 78.0–100.0)
MCV: 92.7 fL (ref 78.0–100.0)
Platelets: 225 10*3/uL (ref 150–400)
Platelets: 230 10*3/uL (ref 150–400)
Platelets: 284 10*3/uL (ref 150–400)
RBC: 4.05 MIL/uL — ABNORMAL LOW (ref 4.22–5.81)
RBC: 4.13 MIL/uL — ABNORMAL LOW (ref 4.22–5.81)
RDW: 12.9 % (ref 11.5–15.5)
RDW: 12.9 % (ref 11.5–15.5)
WBC: 8.6 10*3/uL (ref 4.0–10.5)

## 2010-12-12 LAB — SURGICAL PCR SCREEN: Staphylococcus aureus: NEGATIVE

## 2011-01-15 ENCOUNTER — Encounter: Payer: Self-pay | Admitting: Internal Medicine

## 2011-02-11 ENCOUNTER — Other Ambulatory Visit: Payer: Self-pay | Admitting: Internal Medicine

## 2011-02-16 NOTE — Op Note (Signed)
[facility]  Patient:    Dean Green, Dean Green                       MRN: [id number] Proc. Date: 01/30/01 Adm. Date:  [id number] Attending:  Marlowe Kays Page                           Operative Report  PREOPERATIVE DIAGNOSIS:  Chronic lateral epicondylitis, right elbow.  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  OPERATION PERFORMED:  Repair of common extensor tendon with partial lateral epicondylectomy.  SURGEON:  Dr. Simonne Come.  ASSISTANT:  Nurse.  ANESTHESIA:  General.  PATHOLOGY AND JUSTIFICATION FOR PROCEDURE:  He has had a chronic history of right lateral elbow pain and has had a number of cortisone injections without relief of symptoms and is here today for surgery because of persistent problem. See operative description below for pathology details.  DESCRIPTION OF PROCEDURE:  Satisfactory general anesthesia, pneumatic tourniquet, duraprep from wrist to tourniquet was draped in a sterile field. I made a curved incision midway between the lateral epicondyle and olecranon. I carried this down to the fascia which had a good bit of scarring over the lateral epicondylar area. This was opened at the lateral epicondyle level and the common extensor tendon was noted to be superficially scarred and discolored. With the cutting cautery, I made my initial horizontal U incision to strip it off the lateral epicondyle and then used sharp dissection. At about the most proximal portion of its attachment, there was a hole noted superiorly consistent with the pathology of this problem and this fibrosis of the extensor tendon at this level as well. I completely detached the common extensor tendon so I could look at the joint which was normal and I debrided out the glassy appearing scar on the underneath surface of the extensor tendon. I then used the half inch curved osteotome to perform  superficial lateral epicondylectomy to get nice raw bone. I then placed two drill holes proximally exiting out through the proximal flare of the lateral epicondyle and weaved a #0 Vicryl suture from proximal to distal through the superior hole, through the common extensor tendon and then out through the inferior two holes and with the elbow flexed and the debrided extensor tendon over the raw lateral epicondyle I began repairing the wings of the U with interrupted #0 Vicryl and then finally tying the suture through bone. This gave a nice secure stable repair. The wound was irrigated with sterile saline, the soft tissue was infiltrated with 0.5% Marcaine with adrenaline. I then closed the fascia with interrupted 2-0 Vicryl and the skin and subcutaneous tissue with interrupted 4-0 nylon mattress sutures. Betadine Adaptic dry sterile dressing and a long arm splint cast were applied followed by a sling.  The tourniquet was released prior to application of the splint. He tolerated the procedure well and was taken to the recovery room in satisfactory condition with no known complications and given 30 mg of Toradol IV just prior to closure. DD:  01/30/01 TD:  01/30/01 Job: [id number]

## 2011-02-28 ENCOUNTER — Encounter (INDEPENDENT_AMBULATORY_CARE_PROVIDER_SITE_OTHER): Payer: Self-pay | Admitting: General Surgery

## 2011-03-12 ENCOUNTER — Ambulatory Visit (HOSPITAL_COMMUNITY)
Admission: RE | Admit: 2011-03-12 | Discharge: 2011-03-12 | Disposition: A | Discharge status: Home / Self Care | Payer: Medicare Other | Source: Ambulatory Visit | Attending: General Surgery | Admitting: General Surgery

## 2011-03-12 DIAGNOSIS — Z79899 Other long term (current) drug therapy: Secondary | ICD-10-CM | POA: Insufficient documentation

## 2011-03-12 DIAGNOSIS — Z01812 Encounter for preprocedural laboratory examination: Secondary | ICD-10-CM | POA: Insufficient documentation

## 2011-03-12 DIAGNOSIS — K603 Anal fistula, unspecified: Secondary | ICD-10-CM | POA: Insufficient documentation

## 2011-03-12 HISTORY — PX: TREATMENT FISTULA ANAL: SUR1390

## 2011-03-12 LAB — BASIC METABOLIC PANEL
BUN: 13 mg/dL (ref 6–23)
CO2: 28 mEq/L (ref 19–32)
Chloride: 103 mEq/L (ref 96–112)
Creatinine, Ser: 0.97 mg/dL (ref 0.4–1.5)
Glucose, Bld: 134 mg/dL — ABNORMAL HIGH (ref 70–99)

## 2011-03-12 LAB — CBC
HCT: 42.3 % (ref 39.0–52.0)
Hemoglobin: 14.6 g/dL (ref 13.0–17.0)
MCH: 30.7 pg (ref 26.0–34.0)
MCV: 89.1 fL (ref 78.0–100.0)
Platelets: 283 10*3/uL (ref 150–400)
RBC: 4.75 MIL/uL (ref 4.22–5.81)
WBC: 5.9 10*3/uL (ref 4.0–10.5)

## 2011-03-12 LAB — SURGICAL PCR SCREEN: MRSA, PCR: NEGATIVE

## 2011-03-12 LAB — DIFFERENTIAL
Lymphocytes Relative: 20 % (ref 12–46)
Lymphs Abs: 1.2 10*3/uL (ref 0.7–4.0)
Monocytes Relative: 9 % (ref 3–12)
Neutrophils Relative %: 68 % (ref 43–77)

## 2011-03-12 LAB — GLUCOSE, CAPILLARY: Glucose-Capillary: 130 mg/dL — ABNORMAL HIGH (ref 70–99)

## 2011-03-16 ENCOUNTER — Encounter: Payer: Self-pay | Admitting: Internal Medicine

## 2011-03-16 DIAGNOSIS — Z0289 Encounter for other administrative examinations: Secondary | ICD-10-CM

## 2011-03-16 NOTE — Progress Notes (Signed)
This encounter was created in error - please disregard.

## 2011-04-05 NOTE — Op Note (Signed)
Dean Green, Dean Green                ACCOUNT NO.:  [id number]  MEDICAL RECORD NO.:  [id number]  LOCATION:  [facility]                         FACILITY:  [facility]  PHYSICIAN:  Mary Sella. 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  PROCEDURE: 1. Exam under anesthesia. 2. Fistulotomy.  SURGEON:  Mary Sella. Andrey Campanile, MD  ANESTHESIA:  General plus 30 mL of Exaparel.  FINDINGS:  The patient had an external opening in the right posterolateral position about 1-1/2 cm from the anal verge.  It tracked very superficial into the anal canal.  The length of the tract was about 2-1/2 cm and it was superficial.  There was a little bit that went through the external sphincter, however, portions of it did not. Therefore, I elected to do a fistulotomy.  ESTIMATED BLOOD LOSS:  Minimal.  INDICATIONS FOR PROCEDURE:  The patient is a 70 year old Caucasian male who has had about an 43-month history of intermittent perianal drainage. It is mainly mucousy and yellow.  I had seen on multiple occasions and was able to identify the posterior opening tract and external opening in the right posterolateral position.  I could not identify anything in the office but my concern was that he had a fistula in ano.  It did not heal by itself.  He had been ruled out for inflammatory bowel disease such as Crohn's.  He was not immunocompromised.  Based on his continued drainage, the patient desired surgical intervention.  We discussed the risks and benefits of surgery including bleeding, infection, injury to surrounding structures, namely the sphincters resulting in incontinence, need for additional procedures, urinary retention, and a staged procedure.  We talked about the first  we needed to identify the fistula and confirmed the diagnosis and then we needed second to determine its location and proximity to the sphincter muscles.  I explained to him that this could be resultant of a fistulotomy versus Seton placement. He understood and elected to proceed to surgery.  DESCRIPTION OF PROCEDURE:  After obtaining informed consent, the patient was brought to the operating room.  General endotracheal anesthesia was performed.  Sequential compression devices were then placed.  He was then placed in the prone jackknife position with the appropriate padding.  The table was flexed and his buttocks were taped apart.  He received antibiotics prior to skin incision.  His perineal region and buttocks were prepped with Betadine.  A surgical time-out was performed. Again, I confirmed the right posterolateral opening.  There were no other signs of external opening.  I then did a digital rectal exam and I could feel an indurated area within the anal canal.  Also, I felt a little bit of his superficial induration along the tract.  He had good tone.  I then performed anoscopy and could not definitively see an internal opening.  I therefore obtained syringe filled with hydroperoxide with Angiocath blunt-tip needle on it, injected the external opening and  confirmed the drainage of hydroperoxide from the anal canal.  Then taking fistula probe, blunt-tipped, I placed through the external opening and it easily came out with inside the anal canal. The length of the tract was about 2-1/2 cm.  It appeared to be very superficial.  I soon had my partner, Dr. Michaell Cowing to come in and also inspect the area.  He also felt that it was superficial.  Because it was superficial, I elected to do a fistulotomy.  Using Bovie electrocautery, I cut down directly on top of the probe.  I did cut through a few fibers of the external sphincter, however, there were very few fibers.  I then curetted the fistula  tract.  Hemostasis was achieved with electrocautery.  I then injected Exparel in a regional fashion as well as block.  A piece of Gelfoam smeared with dibucaine ointment was placed in the anal canal and dibucaine ointment was smeared around the perineum.  He was then placed supine and 4 x 4s, ABDs, the mesh underwear were then applied.  He was extubated and taken to recovery in stable condition.  There were no immediate complications.  The patient tolerated the procedure well.     Mary Sella. Andrey Campanile, MD     EMW/MEDQ  D:  03/12/2011  T:  03/13/2011  Job:  [id number]  cc:   Barry Dienes. Eloise Harman, M.D. Titus Dubin. Alwyn Ren, MD,FACP,FCCP  Electronically Signed by Gaynelle Adu M.D. on 04/05/2011 02:04:56 PM

## 2011-04-11 ENCOUNTER — Ambulatory Visit (INDEPENDENT_AMBULATORY_CARE_PROVIDER_SITE_OTHER): Payer: Medicare Other | Admitting: General Surgery

## 2011-04-11 ENCOUNTER — Encounter (INDEPENDENT_AMBULATORY_CARE_PROVIDER_SITE_OTHER): Payer: Self-pay | Admitting: General Surgery

## 2011-04-11 VITALS — Wt 151.0 lb

## 2011-04-11 DIAGNOSIS — K603 Anal fistula, unspecified: Secondary | ICD-10-CM | POA: Insufficient documentation

## 2011-04-11 DIAGNOSIS — Z09 Encounter for follow-up examination after completed treatment for conditions other than malignant neoplasm: Secondary | ICD-10-CM

## 2011-04-11 NOTE — Progress Notes (Signed)
Chief complaint: Postop check  Procedure: Exam under anesthesia, fistulotomy March 12, 2011  History of Present Ilness: The patient comes in today for his first postoperative appointment. He did quite well after surgery. He only took pain pills intermittently for a few days. He denies any fevers, chills, nausea, vomiting, or abdominal pain. He denies any rectal pain. He is having 2-3 bowel movements a day. He is not straining. He denies any incontinence to flatus or stool. He denies any perianal itching or burning. He reports a good appetite. He states he was pleasantly surprised about the lack of discomfort that he had after surgery.  Physical Exam: Well-developed well-nourished Caucasian male in no apparent distress Abdomen-soft nontender nondistended well-healed incision Rectal-no signs of cellulitis, induration, or abscess. The posterior lateral incision is almost completely healed. The skin is almost completely epithelialized. Digital rectal exam was deferred today  Pathology: Not applicable  Data reviewed: My operative note from March 12, 2011  Assessment and Plan: Status post exam under anesthesia and fistulotomy for a right posterior lateral superficial fistula in ano. His fistula was very superficial so that's why we proceeded with the fistulotomy and not a seton placement. I'm that he is doing well. We discussed the operative findings and procedure. I encouraged him to continue with his good bowel habits. I will see him in 6 weeks.

## 2011-04-12 ENCOUNTER — Encounter: Payer: Self-pay | Admitting: Internal Medicine

## 2011-04-12 ENCOUNTER — Ambulatory Visit (INDEPENDENT_AMBULATORY_CARE_PROVIDER_SITE_OTHER): Payer: Medicare Other | Admitting: Internal Medicine

## 2011-04-12 DIAGNOSIS — E119 Type 2 diabetes mellitus without complications: Secondary | ICD-10-CM

## 2011-04-12 DIAGNOSIS — E785 Hyperlipidemia, unspecified: Secondary | ICD-10-CM

## 2011-04-12 DIAGNOSIS — I10 Essential (primary) hypertension: Secondary | ICD-10-CM

## 2011-04-12 NOTE — Patient Instructions (Signed)
Preventive Health Care: Exercise at least 30-45 minutes a day,  3-4 days a week.  Eat a low-fat diet with lots of fruits and vegetables, up to 7-9 servings per day. Consume less than 40 grams of sugar per day from foods & drinks with High Fructose Corn Sugar as # 1,2,3 or # 4 on label. Eye Doctor - have an eye exam @ least annually.                                                         Health Care Power of Attorney & Living Will. Complete if not in place ; these place you in charge of your health care decisions. Diabetes Monitor     The A1c test is used primarily to monitor the glucose control of diabetics over time. The goal of those with diabetes is to keep their blood glucose levels as close to normal as possible. This helps to minimize the complications caused by chronically elevated glucose levels, such as progressive damage to body organs like the kidneys, eyes, cardiovascular system, and nerves. The A1c test gives a picture of the average amount of glucose in the blood over the last few months. It can help a patient and his doctor know if the measures they are taking to control the patient's diabetes are successful or need to be adjusted.    NORMAL VALUES  Non diabetic adults: 5 %-6.1%  Good diabetic control: 6.2-6.4 %  Fair diabetic control: 6.5-7%  Poor diabetic control: greater than 7 % ( except with additional factors such as  advanced age; significant coronary or neurologic disease,etc). Check the A1c every 6 months if it is < 6.5%; every 4 months if  6.5% or higher. Goals for home glucose monitoring are : fasting  or morning glucose goal of  90-150. Two hours after any meal , goal = < 180, preferably < 150.

## 2011-04-12 NOTE — Progress Notes (Signed)
Subjective:    Patient ID: Dean Green, male    DOB: 1941-05-03, 70 y.o.   MRN: [id number]  HPI Dean Green returns for followup. Significantly he was hospitalized 08/17/2010 to November 22 following exploratory surgery to assess pain. The initial exploratory laparotomy was complicated  bladder puncture requiring an  open procedure.  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  His weight is now stable; he feels the weight loss was related to a significant increase in  cardiovascular exercise [place].  Because of continued weight loss; extensive labs had been  performed 01/09/2011 in [place]. These were reviewed and discussed.                      #1  HYPERTENSION: Disease Monitoring: Blood pressure range-not checked  Chest pain, palpitations- no       Dyspnea- no Medications: Compliance- yes  Lightheadedness,Syncope- no    Edema- no    #2 DIABETES: Disease Monitoring: Blood Sugar ranges-FBS average 110-120  Polyuria/phagia/dipsia- no       Visual problems- no Medications: Compliance- yes  Hypoglycemic symptoms- no   #3 HYPERLIPIDEMIA: Disease Monitoring: See symptoms for Hypertension Medications: Compliance- yes  Abd pain, bowel changes- symptoms above resolved post op   Muscle aches- no    ROS See HPI above   PMH Smoking Status noted :quit cigars > 30 years ago                                                                                                                                                                                          Review of Systems :asymptomatic @ present     Objective:   Physical Exam :Gen.: Thin but healthy and well-nourished in appearance. Alert, appropriate and cooperative throughout exam. Head: Normocephalic ; healing abrasion over crown;solar changes; pattern alopecia  Eyes: No corneal or conjunctival inflammation noted.  No icterus Mouth:  Oral mucosa and oropharynx reveal no lesions or exudates. Teeth in good repair. Neck: No deformities, masses, or tenderness noted.  Thyroid  normal. Lungs: Normal respiratory effort; chest expands symmetrically. Lungs are clear to auscultation without rales, wheezes, or increased work of breathing. Heart: Normal rate and rhythm. Normal S1 and S2. No gallop, click, or rub. S4 with slurring; no  murmur. Abdomen: Bowel sounds normal; abdomen soft and nontender. No masses, organomegaly or hernias noted.Op scar well healed Musculoskeletal/extremities: No deformity or scoliosis noted of  the thoracic or lumbar spine. No clubbing, cyanosis, edema, or deformity noted. Joints: minimal DIP changes. Nail health: mild toenail changes. Vascular:  Carotid, radial artery, dorsalis pedis and  posterior tibial pulses are full and equal. No bruits present. Neurologic: Alert and oriented x3. Deep tendon reflexes symmetrical and normal.          Skin: Intact without suspicious lesions or rashes.(see HEAD) Lymph: No cervical, axillary  lymphadenopathy present. Psych: Mood and affect are normal. Normally interactive                                                                                         Assessment & Plan:  #1 diabetes, A1c now  In  nondiabetic range  #2 dyslipidemia, LDL is minimally elevated but the HDL is protective at 51  #3 hypertension well controlled  #4 weight loss, physiologic. This is now an inactive issue.  #5 abdominal pain due to adhesions from  diverticular disease, resolved after removal  #6 B12 level low normal  Plan: Activity should be discontinued because of the present A1 C. level. Also there is some question of bladder cancer risk increase.  The A1c should be rechecked in 6 months with the B12 level.

## 2011-05-23 ENCOUNTER — Ambulatory Visit (INDEPENDENT_AMBULATORY_CARE_PROVIDER_SITE_OTHER): Payer: Medicare Other | Admitting: General Surgery

## 2011-05-23 ENCOUNTER — Encounter (INDEPENDENT_AMBULATORY_CARE_PROVIDER_SITE_OTHER): Payer: Self-pay | Admitting: General Surgery

## 2011-05-23 VITALS — BP 142/80 | HR 80

## 2011-05-23 DIAGNOSIS — Z09 Encounter for follow-up examination after completed treatment for conditions other than malignant neoplasm: Secondary | ICD-10-CM

## 2011-05-23 NOTE — Progress Notes (Signed)
Procedure: Status post exam under anesthesia, fistulotomy for right posterior lateral superficial fistula in ano on March 12, 2011  History of Present Ilness: 70 year old male comes in today for his second postoperative appointment. He has no complaints today. He denies any diarrhea or constipation. He denies any incontinence. He denies any pain with defecation. He denies any abdominal pain or trouble urinating. He reports daily bowel movements. He denies any melena or hematochezia.  Physical Exam: BP 142/80  Pulse 80  Well-developed well-nourished Caucasian male in no apparent distress Pulmonary-lungs are clear to auscultation Cardiac-regular rate and rhythm Abdomen-soft, nontender, nondistended. Well-healed lower midline incision. No signs of incisional hernia Rectal-well healed right posterior lateral incision. No sign of fissure or fistula. Excellent rectal tone on DRE.  Data reviewed: I reviewed my operative note from June 11 as well as my most recent office visit note from July 11    Assessment and Plan: Status post exam under anesthesia and fistulotomy for right posterior lateral superficial fistula in ano-doing well  His rectal tone is excellent. He has no signs or symptoms of incontinence. His fistula is completely healed.  We discussed ongoing good bowel habits. He was given Agricultural engineer.  I will see him on an as-needed basis.

## 2011-05-23 NOTE — Patient Instructions (Signed)

## 2011-07-04 LAB — GLUCOSE, CAPILLARY: Glucose-Capillary: 112 — ABNORMAL HIGH

## 2013-04-14 ENCOUNTER — Encounter: Payer: Self-pay | Admitting: Gastroenterology

## 2014-06-21 ENCOUNTER — Encounter: Payer: Self-pay | Admitting: Gastroenterology

## 2016-05-08 ENCOUNTER — Encounter: Payer: Self-pay | Admitting: Podiatry

## 2016-05-08 ENCOUNTER — Ambulatory Visit (INDEPENDENT_AMBULATORY_CARE_PROVIDER_SITE_OTHER): Payer: Medicare Other

## 2016-05-08 ENCOUNTER — Ambulatory Visit: Payer: Medicare Other

## 2016-05-08 ENCOUNTER — Ambulatory Visit (INDEPENDENT_AMBULATORY_CARE_PROVIDER_SITE_OTHER): Payer: Medicare Other | Admitting: Podiatry

## 2016-05-08 VITALS — BP 121/54 | HR 71 | Resp 16

## 2016-05-08 DIAGNOSIS — E119 Type 2 diabetes mellitus without complications: Secondary | ICD-10-CM | POA: Diagnosis not present

## 2016-05-08 DIAGNOSIS — M722 Plantar fascial fibromatosis: Secondary | ICD-10-CM

## 2016-05-08 DIAGNOSIS — Z0189 Encounter for other specified special examinations: Secondary | ICD-10-CM

## 2016-05-08 MED ORDER — MELOXICAM 15 MG PO TABS: 15.0000 mg | ORAL_TABLET | Freq: Every day | ORAL | 3 refills | Status: DC

## 2016-05-08 MED ORDER — METHYLPREDNISOLONE 4 MG PO TBPK: ORAL_TABLET | ORAL | 0 refills | Status: DC

## 2016-05-08 NOTE — Progress Notes (Signed)
   Subjective:    Patient ID: Dean Green, male    DOB: 1941/01/20, 75 y.o.   MRN: [id number]  HPI: He presents today with chief complaint of right heel pain. He states this and aching for several months morning pain is particularly bad. He does relate being a diabetic and his hemoglobin A1c is a 5.7.    Review of Systems  All other systems reviewed and are negative.      Objective:   Physical Exam: Vital signs are stable alert and oriented 3 pulses are strongly palpable. Neurologic sensorium is intact. Deep tendon reflexes are intact. Muscle strength is 5 over 5 dorsiflexion plantar flexors and inverters everters all to the musculature is intact. Orthopedic evaluation does demonstrate pain on palpation medial tubercle of the right heel. Radiographs do demonstrate soft tissue increase in density at the plantar fascia insertion site of the right foot today no other major osseous abnormalities are noted. Cutaneous evaluation and straight supple well-hydrated cutis no open lesions or wounds are noted.          Assessment & Plan:  Assessment: Plantar fasciitis right.  Plan: Discussed etiology pathology conservative versus surgical therapies. Injected the right heel today placed him in a plantar fascia brace he already has a night splint at home. We discussed appropriate shoe gear stretching exercises and ice therapy. I started him on a Medrol Dosepak to be followed by meloxicam and I will follow-up with him in one month to 6 weeks if necessary.

## 2016-05-08 NOTE — Patient Instructions (Signed)

## 2016-06-19 ENCOUNTER — Ambulatory Visit: Payer: Medicare Other | Admitting: Podiatry

## 2016-07-12 ENCOUNTER — Ambulatory Visit (INDEPENDENT_AMBULATORY_CARE_PROVIDER_SITE_OTHER): Payer: Medicare Other | Admitting: Podiatry

## 2016-07-12 ENCOUNTER — Ambulatory Visit: Payer: Medicare Other | Admitting: Podiatry

## 2016-07-12 DIAGNOSIS — M722 Plantar fascial fibromatosis: Secondary | ICD-10-CM | POA: Diagnosis not present

## 2016-07-12 NOTE — Progress Notes (Signed)
He presents today for follow-up of plantar fasciitis to his right foot. He states this seems to be doing better but is not well yet.  Objective: Vital signs are stable alert and oriented 3. Pulses are palpable. He has pain on palpation medially continue tubercle of the right heel.  Assessment: Plantar fasciitis right.  Plan: Follow up with me in 2 months all up with me sooner if needed. I injected the right heel again today.

## 2016-09-07 ENCOUNTER — Other Ambulatory Visit: Payer: Self-pay | Admitting: Podiatry

## 2016-09-11 ENCOUNTER — Ambulatory Visit: Payer: Medicare Other | Admitting: Podiatry

## 2016-09-11 NOTE — Telephone Encounter (Signed)
Pt needs an appt prior to refills. 

## 2016-10-18 NOTE — Progress Notes (Deleted)
   Office Visit Note  Patient: Dean Green             Date of Birth: [birth year]           MRN: [id number]             PCP: No primary care provider on file. Referring: Ronnald NianMichie, David, MD Visit Date: 10/19/2016 Occupation: @GUAROCC @    Subjective:  No chief complaint on file.   History of Present Illness: Dean Green is a 76 y.o. male ***   Activities of Daily Living:  Patient reports morning stiffness for *** {minute/hour:19697}.   Patient {ACTIONS;DENIES/REPORTS:21021675::"Denies"} nocturnal pain.  Difficulty dressing/grooming: {ACTIONS;DENIES/REPORTS:21021675::"Denies"} Difficulty climbing stairs: {ACTIONS;DENIES/REPORTS:21021675::"Denies"} Difficulty getting out of chair: {ACTIONS;DENIES/REPORTS:21021675::"Denies"} Difficulty using hands for taps, buttons, cutlery, and/or writing: {ACTIONS;DENIES/REPORTS:21021675::"Denies"}   No Rheumatology ROS completed.   PMFS History:  Patient Active Problem List   Diagnosis Date Noted  . ABSCESS OF ANAL AND RECTAL REGIONS 08/08/2010  . CHRONIC PANCREATITIS 04/12/2010  . LUQ PAIN 03/01/2010  . B12 DEFICIENCY 12/22/2009  . ENCOPRESIS 12/19/2009  . IBS 12/19/2009  . ABDOMINAL PAIN-EPIGASTRIC 12/19/2009  . COLONIC POLYPS, HX OF 09/09/2008  . HIATAL HERNIA 03/19/2008  . DIABETES MELLITUS, TYPE II 02/25/2008  . HYPERLIPIDEMIA 02/25/2008  . HYPERTENSION 02/25/2008  . GERD 02/25/2008  . BARRETTS ESOPHAGUS 02/25/2008  . DIVERTICULOSIS, COLON 02/25/2008  . NEPHROLITHIASIS, HX OF 02/25/2008    Past Medical History:  Diagnosis Date  . Diabetes mellitus   . GERD (gastroesophageal reflux disease)   . Hyperlipidemia   . Hypertension   . Rectal fistula   . Wears glasses     Family History  Problem Relation Age of Onset  . Adopted: Yes   Past Surgical History:  Procedure Laterality Date  . ABDOMINAL EXPLORATION SURGERY  08/17/2010  . BLADDER REPAIR  08/17/2010  . COLECTOMY     partial for diverticulitis 1997  .  COLONOSCOPY W/ POLYPECTOMY  2009   Dr.Patterson  . DIAGNOSTIC LAPAROSCOPY  08/17/2010   lysis of adhesions with bladder injury  . ELBOW SURGERY    . TREATMENT FISTULA ANAL  03/12/2011   fistuolotomy   Social History   Social History Narrative  . No narrative on file     Objective: Vital Signs: There were no vitals taken for this visit.   Physical Exam   Musculoskeletal Exam: ***  CDAI Exam: No CDAI exam completed.    Investigation: No additional findings.   Imaging: No results found.  Speciality Comments: No specialty comments available.    Procedures:  No procedures performed Allergies: Patient has no known allergies.   Assessment / Plan:     Visit Diagnoses: No diagnosis found.    Orders: No orders of the defined types were placed in this encounter.  No orders of the defined types were placed in this encounter.   Face-to-face time spent with patient was *** minutes. 50% of time was spent in counseling and coordination of care.  Follow-Up Instructions: No Follow-up on file.   Pollyann SavoyShaili Grae Leathers, MD  Note - This record has been created using Animal nutritionistDragon software.  Chart creation errors have been sought, but may not always  have been located. Such creation errors do not reflect on  the standard of medical care.

## 2016-10-19 ENCOUNTER — Ambulatory Visit: Payer: Medicare Other | Admitting: Rheumatology

## 2016-11-30 ENCOUNTER — Ambulatory Visit: Payer: Medicare Other | Admitting: Rheumatology

## 2016-12-03 NOTE — Progress Notes (Signed)
Office Visit Note  Patient: Dean Green             Date of Birth: 01/22/1941           MRN: [id number]             PCP: Ronnald Nian, MD Referring: Ronnald Nian, MD Visit Date: 12/05/2016 Occupation customer service    Subjective:  Pain hands   History of Present Illness: Dean Green is a 76 y.o. male seen in consultation by request of Dr. Clide Dales. According to patient he started having pain in his right third finger about 2 years ago. He describes pain and swelling in the right third PIP joint. In the last 6 months he's noticed that his pain has spread to his left second and fifth finger as well. He is having discomfort in his left elbow with some tenderness. None of the other joints are painful. He has noticed decreased grip strength.There is no history of psoriasis he does notice dry skin on his hands. He had flu about 3 weeks ago from which he recovered. Now he has upper respiratory tract infection with some cough. He denies any fever.  Activities of Daily Living:  Patient reports morning stiffness for 0 minute.   Patient Denies nocturnal pain.  Difficulty dressing/grooming: Denies Difficulty climbing stairs: Denies Difficulty getting out of chair: Denies Difficulty using hands for taps, buttons, cutlery, and/or writing: Reports   Review of Systems  Constitutional: Negative for fatigue, night sweats and weakness ( ).  HENT: Negative for mouth sores, mouth dryness and nose dryness.   Eyes: Negative for redness and dryness.  Respiratory: Negative for shortness of breath and difficulty breathing.   Cardiovascular: Negative for chest pain, palpitations, hypertension, irregular heartbeat and swelling in legs/feet.  Gastrointestinal: Negative for constipation and diarrhea.  Endocrine: Negative for increased urination.  Musculoskeletal: Positive for arthralgias and joint pain. Negative for joint swelling, myalgias, muscle weakness, morning stiffness, muscle tenderness and  myalgias.  Skin: Negative for color change, rash, hair loss, nodules/bumps, skin tightness, ulcers and sensitivity to sunlight.  Allergic/Immunologic: Negative for susceptible to infections.  Neurological: Negative for dizziness, fainting, memory loss and night sweats.  Hematological: Negative for swollen glands.  Psychiatric/Behavioral: Negative for depressed mood and sleep disturbance. The patient is not nervous/anxious.     PMFS History:  Patient Active Problem List   Diagnosis Date Noted  . History of prostate cancer 12/05/2016  . ABSCESS OF ANAL AND RECTAL REGIONS 08/08/2010  . CHRONIC PANCREATITIS 04/12/2010  . LUQ PAIN 03/01/2010  . B12 DEFICIENCY 12/22/2009  . ENCOPRESIS 12/19/2009  . IBS 12/19/2009  . ABDOMINAL PAIN-EPIGASTRIC 12/19/2009  . COLONIC POLYPS, HX OF 09/09/2008  . HIATAL HERNIA 03/19/2008  . DIABETES MELLITUS, TYPE II 02/25/2008  . Dyslipidemia 02/25/2008  . Essential hypertension 02/25/2008  . GERD 02/25/2008  . BARRETTS ESOPHAGUS 02/25/2008  . Diverticulosis of colon 02/25/2008  . NEPHROLITHIASIS, HX OF 02/25/2008    Past Medical History:  Diagnosis Date  . Diabetes mellitus   . GERD (gastroesophageal reflux disease)   . Hyperlipidemia   . Hypertension   . Rectal fistula   . Wears glasses     Family History  Problem Relation Age of Onset  . Adopted: Yes   Past Surgical History:  Procedure Laterality Date  . ABDOMINAL EXPLORATION SURGERY  08/17/2010  . BLADDER REPAIR  08/17/2010  . COLECTOMY     partial for diverticulitis 1997  . COLONOSCOPY W/ POLYPECTOMY  2009  Dr.Patterson  . DIAGNOSTIC LAPAROSCOPY  08/17/2010   lysis of adhesions with bladder injury  . ELBOW SURGERY    . TREATMENT FISTULA ANAL  03/12/2011   fistuolotomy   Social History   Social History Narrative  . No narrative on file     Objective: Vital Signs: BP (!) 105/57   Pulse 69   Resp 13   Ht 5\' 5"  (1.651 m)   Wt 153 lb (69.4 kg)   BMI 25.46 kg/m    Physical  Exam  Constitutional: He is oriented to person, place, and time. He appears well-developed and well-nourished.  HENT:  Head: Normocephalic and atraumatic.  Eyes: Conjunctivae and EOM are normal. Pupils are equal, round, and reactive to light.  Neck: Normal range of motion. Neck supple.  Cardiovascular: Normal rate, regular rhythm and normal heart sounds.   Pulmonary/Chest: Effort normal and breath sounds normal.  Abdominal: Soft. Bowel sounds are normal.  Neurological: He is alert and oriented to person, place, and time.  Skin: Skin is warm and dry. Capillary refill takes less than 2 seconds.  Psychiatric: He has a normal mood and affect. His behavior is normal.  Nursing note and vitals reviewed.    Musculoskeletal Exam: C-spine, thoracic, lumbar spine good range of motion. Shoulder joints elbow joints wrist joints are good range of motion. He has some thickening of his right second MCP joint he has thickening of bilateral PIP/DIP joints and CMC joints with no synovitis, hip joints knee joints ankles MTPs PIPs DIPs with good range of motion with no synovitis.  CDAI Exam: No CDAI exam completed.    Investigation: No additional findings.   Imaging: No results found.  Speciality Comments: No specialty comments available.    Procedures:  No procedures performed Allergies: Patient has no known allergies.   Assessment / Plan:     Visit Diagnoses: Pain in both hands -clinical and radiographic findings are consistent with osteoarthritis of bilateral hands. Although he does have discomfort in his left second MCP joint which could be associated with osteoarthritis due to aggressive hand use. He does part spit and some farming. It can also be seen with crystal-induced arthropathy. I'll obtain following labs. Plan: XR Hand 2 View Right, XR Hand 2 View Left, CBC with Differential/Platelet, COMPLETE METABOLIC PANEL WITH GFR, Sedimentation rate, Rheumatoid factor, Cyclic citrul peptide  antibody, IgG, Uric acid, Iron and TIBC, Magnesium. I've given him a handout on osteoarthritis supplements.  Osteoarthritis of bilateral hands: Head and muscle exercises were demonstrated and a handout was given. Joint protection was discussed.  Lateral epicondylitis of left elbow: Detailed counseling was provided. I've also given him some exercises and a prescription for tennis elbow brace.  Essential hypertension: Controlled  Dyslipidemia  History of diabetes mellitus  Gastroesophageal reflux disease without esophagitis  Diverticulosis of colon  Irritable bowel syndrome, unspecified type  NEPHROLITHIASIS, HX OF  History of prostate cancer - Onset 08/11/2015    Orders: Orders Placed This Encounter  Procedures  . XR Hand 2 View Right  . XR Hand 2 View Left  . CBC with Differential/Platelet  . COMPLETE METABOLIC PANEL WITH GFR  . Sedimentation rate  . Rheumatoid factor  . Cyclic citrul peptide antibody, IgG  . Uric acid  . Iron and TIBC  . Magnesium   No orders of the defined types were placed in this encounter.   Face-to-face time spent with patient was 45 minutes. 50% of time was spent in counseling and coordination of care.  Follow-Up  Instructions: Return for Osteoarthritis.   Riti Rollyson, MPollyann Savoy  Note - This record has been created using [employer] software.  Chart creation errors have been sought, but may not always  have been located. Such creation errors do not reflect on  the standard of medical care.

## 2016-12-05 ENCOUNTER — Ambulatory Visit (INDEPENDENT_AMBULATORY_CARE_PROVIDER_SITE_OTHER): Payer: Self-pay

## 2016-12-05 ENCOUNTER — Encounter: Payer: Self-pay | Admitting: Rheumatology

## 2016-12-05 ENCOUNTER — Ambulatory Visit (INDEPENDENT_AMBULATORY_CARE_PROVIDER_SITE_OTHER): Payer: Medicare Other

## 2016-12-05 ENCOUNTER — Ambulatory Visit (INDEPENDENT_AMBULATORY_CARE_PROVIDER_SITE_OTHER): Payer: Medicare Other | Admitting: Rheumatology

## 2016-12-05 VITALS — BP 105/57 | HR 69 | Resp 13 | Ht 65.0 in | Wt 153.0 lb

## 2016-12-05 DIAGNOSIS — K219 Gastro-esophageal reflux disease without esophagitis: Secondary | ICD-10-CM | POA: Diagnosis not present

## 2016-12-05 DIAGNOSIS — M7712 Lateral epicondylitis, left elbow: Secondary | ICD-10-CM

## 2016-12-05 DIAGNOSIS — K589 Irritable bowel syndrome without diarrhea: Secondary | ICD-10-CM

## 2016-12-05 DIAGNOSIS — E785 Hyperlipidemia, unspecified: Secondary | ICD-10-CM

## 2016-12-05 DIAGNOSIS — Z8546 Personal history of malignant neoplasm of prostate: Secondary | ICD-10-CM

## 2016-12-05 DIAGNOSIS — M19042 Primary osteoarthritis, left hand: Secondary | ICD-10-CM

## 2016-12-05 DIAGNOSIS — I10 Essential (primary) hypertension: Secondary | ICD-10-CM | POA: Diagnosis not present

## 2016-12-05 DIAGNOSIS — M79641 Pain in right hand: Secondary | ICD-10-CM | POA: Insufficient documentation

## 2016-12-05 DIAGNOSIS — K5730 Diverticulosis of large intestine without perforation or abscess without bleeding: Secondary | ICD-10-CM | POA: Diagnosis not present

## 2016-12-05 DIAGNOSIS — Z8639 Personal history of other endocrine, nutritional and metabolic disease: Secondary | ICD-10-CM | POA: Insufficient documentation

## 2016-12-05 DIAGNOSIS — Z87442 Personal history of urinary calculi: Secondary | ICD-10-CM

## 2016-12-05 DIAGNOSIS — M79642 Pain in left hand: Secondary | ICD-10-CM

## 2016-12-05 DIAGNOSIS — M19041 Primary osteoarthritis, right hand: Secondary | ICD-10-CM | POA: Diagnosis not present

## 2016-12-05 LAB — COMPLETE METABOLIC PANEL WITH GFR
ALT: 20 U/L (ref 9–46)
AST: 21 U/L (ref 10–35)
Albumin: 3.9 g/dL (ref 3.6–5.1)
Alkaline Phosphatase: 65 U/L (ref 40–115)
BILIRUBIN TOTAL: 0.5 mg/dL (ref 0.2–1.2)
BUN: 17 mg/dL (ref 7–25)
CHLORIDE: 102 mmol/L (ref 98–110)
CO2: 25 mmol/L (ref 20–31)
Calcium: 9.5 mg/dL (ref 8.6–10.3)
Creat: 1.22 mg/dL — ABNORMAL HIGH (ref 0.70–1.18)
GFR, EST NON AFRICAN AMERICAN: 58 mL/min — AB (ref >=60)
GFR, Est African American: 68 mL/min (ref >=60)
GLUCOSE: 136 mg/dL — AB (ref 65–99)
Potassium: 5.3 mmol/L (ref 3.5–5.3)
SODIUM: 140 mmol/L (ref 135–146)
TOTAL PROTEIN: 6.4 g/dL (ref 6.1–8.1)

## 2016-12-05 LAB — CBC WITH DIFFERENTIAL/PLATELET
BASOS ABS: 0 {cells}/uL (ref 0–200)
BASOS PCT: 0 %
Eosinophils Absolute: 0 cells/uL — ABNORMAL LOW (ref 15–500)
Eosinophils Relative: 0 %
HCT: 40.2 % (ref 38.5–50.0)
Hemoglobin: 13.4 g/dL (ref 13.2–17.1)
LYMPHS PCT: 9 %
Lymphs Abs: 882 cells/uL (ref 850–3900)
MCH: 30.9 pg (ref 27.0–33.0)
MCHC: 33.3 g/dL (ref 32.0–36.0)
MCV: 92.8 fL (ref 80.0–100.0)
MONOS PCT: 4 %
MPV: 9.2 fL (ref 7.5–12.5)
Monocytes Absolute: 392 cells/uL (ref 200–950)
NEUTROS ABS: 8526 {cells}/uL — AB (ref 1500–7800)
Neutrophils Relative %: 87 %
PLATELETS: 366 10*3/uL (ref 140–400)
RBC: 4.33 MIL/uL (ref 4.20–5.80)
RDW: 13.6 % (ref 11.0–15.0)
WBC: 9.8 10*3/uL (ref 3.8–10.8)

## 2016-12-05 LAB — IRON AND TIBC
%SAT: 20 % (ref 15–60)
IRON: 52 ug/dL (ref 50–180)
TIBC: 260 ug/dL (ref 250–425)
UIBC: 208 ug/dL (ref 125–400)

## 2016-12-05 LAB — URIC ACID: URIC ACID, SERUM: 5.6 mg/dL (ref 4.0–8.0)

## 2016-12-05 LAB — MAGNESIUM: Magnesium: 1.7 mg/dL (ref 1.5–2.5)

## 2016-12-05 NOTE — Patient Instructions (Signed)
Supplements for OA Natural anti-inflammatories  You can purchase these at [employer], [employer] or online.  . Turmeric (capsules)  . Ginger (ginger root or capsules)  . Omega 3 (Fish, flax seeds, chia seeds, walnuts, almonds)  . Tart cherry (dried or extract)   Patient should be under the care of a physician while taking these supplements. This may not be reproduced without the permission  Tennis Elbow Rehab Ask your health care provider which exercises are safe for you. Do exercises exactly as told by your health care provider and adjust them as directed. It is normal to feel mild stretching, pulling, tightness, or discomfort as you do these exercises, but you should stop right away if you feel sudden pain or your pain gets worse. Do not begin these exercises until told by your health care provider. Stretching and range of motion exercises These exercises warm up your muscles and joints and improve the movement and flexibility of your elbow. These exercises also help to relieve pain, numbness, and tingling. Exercise A: Wrist extensor stretch  1. Extend your left / right elbow with your fingers pointing down. 2. Gently pull the palm of your left / right hand toward you until you feel a gentle stretch on the top of your forearm. 3. To increase the stretch, push your left / right hand toward the outer edge or pinkie side of your forearm. 4. Hold this position for __________ seconds. Repeat __________ times. Complete this exercise __________ times a day. If directed by your health care provider, repeat this stretch except do it with a bent elbow this time. Exercise B: Wrist flexor stretch   1. Extend your left / right elbow and turn your palm upward. 2. Gently pull your left / right palm and fingertips back so your wrist extends and your fingers point more toward the ground. 3. You should feel a gentle stretch on the inside of your forearm. 4. Hold this position for __________  seconds. Repeat __________ times. Complete this exercise __________ times a day. If directed by your health care provider, repeat this stretch except do it with a bent elbow this time. Strengthening exercises These exercises build strength and endurance in your elbow. Endurance is the ability to use your muscles for a long time, even after they get tired. Exercise C: Wrist extensors   1. Sit with your left / right forearm palm-down and fully supported on a table or countertop. Your elbow should be resting below the height of your shoulder. 2. Let your left / right wrist extend over the edge of the surface. 3. Loosely hold a __________ weight or a piece of rubber exercise band or tubing in your left / right hand. Slowly curl your left / right hand up toward your forearm. If you are using band or tubing, hold the band or tubing in place with your other hand to provide resistance. 4. Hold this position for __________ seconds. 5. Slowly return to the starting position. Repeat __________ times. Complete this exercise __________ times a day. Exercise D: Radial deviators   1. Stand with a __________ weight in your left / righthand. Or, sit while holding a rubber exercise band or tubing with your other arm supported on a table or countertop. Position your hand so your thumb is on top. 2. Raise your hand upward in front of you so your thumb travels toward your forearm, or pull up on the rubber tubing. 3. Hold this position for __________ seconds. 4. Slowly return to  the starting position. Repeat __________ times. Complete this exercise __________ times a day. Exercise E: Eccentric wrist extensors  1. Sit with your left / right forearm palm-down and fully supported on a table or countertop. Your elbow should be resting below the height of your shoulder. 2. If told by your health care provider, hold a __________ weight in your hand. 3. Let your left / right wrist extend over the edge of the  surface. 4. Use your other hand to lift up your left / right hand toward your forearm. Keep your forearm on the table. 5. Using only the muscles in your left / right hand, slowly lower your hand back down to the starting position. Repeat __________ times. Complete this exercise __________ times a day. This information is not intended to replace advice given to you by your health care provider. Make sure you discuss any questions you have with your health care provider. Document Released: 09/17/2005 Document Revised: 05/23/2016 Document Reviewed: 06/16/2015 Elsevier Interactive Patient Education  2017 [employer]   Hand Exercises Hand exercises can be helpful to almost anyone. These exercises can strengthen the hands, improve flexibility and movement, and increase blood flow to the hands. These results can make work and daily tasks easier. Hand exercises can be especially helpful for people who have joint pain from arthritis or have nerve damage from overuse (carpal tunnel syndrome). These exercises can also help people who have injured a hand. Most of these hand exercises are fairly gentle stretching routines. You can do them often throughout the day. Still, it is a good idea to ask your health care provider which exercises would be best for you. Warming your hands before exercise may help to reduce stiffness. You can do this with gentle massage or by placing your hands in warm water for 15 minutes. Also, make sure you pay attention to your level of hand pain as you begin an exercise routine. Exercises Knuckle Bend  Repeat this exercise 5-10 times with each hand. 5. Stand or sit with your arm, hand, and all five fingers pointed straight up. Make sure your wrist is straight. 6. Gently and slowly bend your fingers down and inward until the tips of your fingers are touching the tops of your palm. 7. Hold this position for a few seconds. 8. Extend your fingers out to their original position, all  pointing straight up again. Finger Fan  Repeat this exercise 5-10 times with each hand. 5. Hold your arm and hand out in front of you. Keep your wrist straight. 6. Squeeze your hand into a fist. 7. Hold this position for a few seconds. 8. Loel Dubonnet out, or spread apart, your hand and fingers as much as possible, stretching every joint fully. Tabletop  Repeat this exercise 5-10 times with each hand. 6. Stand or sit with your arm, hand, and all five fingers pointed straight up. Make sure your wrist is straight. 7. Gently and slowly bend your fingers at the knuckles where they meet the hand until your hand is making an upside-down L shape. Your fingers should form a tabletop. 8. Hold this position for a few seconds. 9. Extend your fingers out to their original position, all pointing straight up again. Making Os  Repeat this exercise 5-10 times with each hand. 5. Stand or sit with your arm, hand, and all five fingers pointed straight up. Make sure your wrist is straight. 6. Make an O shape by touching your pointer finger to your thumb. Hold for a  few seconds. Then open your hand wide. 7. Repeat this motion with each finger on your hand. Table Spread  Repeat this exercise 5-10 times with each hand. 6. Place your hand on a table with your palm facing down. Make sure your wrist is straight. 7. Spread your fingers out as much as possible. Hold this position for a few seconds. 8. Slide your fingers back together again. Hold for a few seconds. Ball Grip   Repeat this exercise 10-15 times with each hand. 1. Hold a tennis ball or another soft ball in your hand. 2. While slowly increasing pressure, squeeze the ball as hard as possible. 3. Squeeze as hard as you can for 3-5 seconds. 4. Relax and repeat. Wrist Curls  Repeat this exercise 10-15 times with each hand. 1. Sit in a chair that has armrests. 2. Hold a light weight in your hand, such as a dumbbell that weighs 1-3 pounds (0.5-1.4 kg). Ask your  health care provider what weight would be best for you. 3. Rest your hand just over the end of the chair arm with your palm facing up. 4. Gently pivot your wrist up and down while holding the weight. Do not twist your wrist from side to side. Contact a health care provider if:  Your hand pain or discomfort gets much worse when you do an exercise.  Your hand pain or discomfort does not improve within 2 hours after you exercise. If you have any of these problems, stop doing these exercises right away. Do not do them again unless your health care provider says that you can. Get help right away if:  You develop sudden, severe hand pain. If this happens, stop doing these exercises right away. Do not do them again unless your health care provider says that you can. This information is not intended to replace advice given to you by your health care provider. Make sure you discuss any questions you have with your health care provider. Document Released: 08/29/2015 Document Revised: 02/23/2016 Document Reviewed: 03/28/2015 Elsevier Interactive Patient Education  2017 [employer].

## 2016-12-06 LAB — SEDIMENTATION RATE: Sed Rate: 11 mm/hr (ref 0–20)

## 2016-12-06 LAB — CYCLIC CITRUL PEPTIDE ANTIBODY, IGG

## 2016-12-06 LAB — RHEUMATOID FACTOR

## 2016-12-06 NOTE — Progress Notes (Signed)
Will discuss at fu visit

## 2017-01-02 ENCOUNTER — Ambulatory Visit: Payer: Medicare Other | Admitting: Rheumatology

## 2017-01-04 ENCOUNTER — Ambulatory Visit: Payer: Medicare Other | Admitting: Rheumatology

## 2017-01-23 ENCOUNTER — Encounter: Payer: Self-pay | Admitting: Rheumatology

## 2017-01-23 ENCOUNTER — Ambulatory Visit (INDEPENDENT_AMBULATORY_CARE_PROVIDER_SITE_OTHER): Payer: Medicare Other | Admitting: Rheumatology

## 2017-01-23 ENCOUNTER — Inpatient Hospital Stay (INDEPENDENT_AMBULATORY_CARE_PROVIDER_SITE_OTHER): Payer: Medicare Other

## 2017-01-23 VITALS — BP 144/72 | HR 62 | Resp 13 | Ht 65.0 in | Wt 149.0 lb

## 2017-01-23 DIAGNOSIS — Z8546 Personal history of malignant neoplasm of prostate: Secondary | ICD-10-CM

## 2017-01-23 DIAGNOSIS — M79641 Pain in right hand: Secondary | ICD-10-CM

## 2017-01-23 DIAGNOSIS — E785 Hyperlipidemia, unspecified: Secondary | ICD-10-CM

## 2017-01-23 DIAGNOSIS — Z8639 Personal history of other endocrine, nutritional and metabolic disease: Secondary | ICD-10-CM

## 2017-01-23 DIAGNOSIS — M19041 Primary osteoarthritis, right hand: Secondary | ICD-10-CM

## 2017-01-23 DIAGNOSIS — M19042 Primary osteoarthritis, left hand: Secondary | ICD-10-CM

## 2017-01-23 DIAGNOSIS — I10 Essential (primary) hypertension: Secondary | ICD-10-CM | POA: Diagnosis not present

## 2017-01-23 DIAGNOSIS — Z87442 Personal history of urinary calculi: Secondary | ICD-10-CM | POA: Diagnosis not present

## 2017-01-23 DIAGNOSIS — K5730 Diverticulosis of large intestine without perforation or abscess without bleeding: Secondary | ICD-10-CM

## 2017-01-23 DIAGNOSIS — K219 Gastro-esophageal reflux disease without esophagitis: Secondary | ICD-10-CM

## 2017-01-23 DIAGNOSIS — M79642 Pain in left hand: Secondary | ICD-10-CM | POA: Diagnosis not present

## 2017-01-23 DIAGNOSIS — M7712 Lateral epicondylitis, left elbow: Secondary | ICD-10-CM | POA: Diagnosis not present

## 2017-01-23 DIAGNOSIS — K589 Irritable bowel syndrome without diarrhea: Secondary | ICD-10-CM | POA: Diagnosis not present

## 2017-01-23 MED ORDER — DICLOFENAC SODIUM 1 % TD GEL: 2.0000 g | Freq: Four times a day (QID) | TRANSDERMAL | 0 refills | Status: AC

## 2017-01-23 NOTE — Progress Notes (Signed)
Patient was prescribed Voltaren gel today.  Counseled patient on purpose, proper use, and adverse effects of Voltaren gel.  Advised that insurance does not always cover this medication.  Provided patient with a GoodRx coupon card to use if insurance does not cover the medication.  Patient voiced understanding and denies any questions or concerns regarding his medications.    Lilla Shook, Pharm.D., BCPS, CPP Clinical Pharmacist Pager: [phone] Phone: [phone] 01/23/2017 2:31 PM

## 2017-01-23 NOTE — Progress Notes (Signed)
Office Visit Note  Patient: Dean Green             Date of Birth: Feb 18, 1941           MRN: [id number]             PCP: Atha Starks, MD Referring: Atha Starks, MD Visit Date: 01/23/2017 Occupation: '@GUAROCC' @    Subjective: Pain hands   History of Present Illness: Dean Green is a 76 y.o. male with osteoarthritis. He continues to have pain and discomfort in his bilateral hands and left elbow. He states he has increased discomfort in his right third PIP joint and left second PIP joint. She denies any joint swelling. None of the other joints are painful.   Activities of Daily Living:  Patient reports morning stiffness for 0 minute.   Patient Denies nocturnal pain.  Difficulty dressing/grooming: Denies Difficulty climbing stairs: Denies Difficulty getting out of chair: Denies Difficulty using hands for taps, buttons, cutlery, and/or writing: Reports   Review of Systems  Constitutional: Negative for fatigue, night sweats and weakness ( ).  HENT: Negative for mouth sores, mouth dryness and nose dryness.   Eyes: Negative for redness and dryness.  Respiratory: Negative for shortness of breath and difficulty breathing.   Cardiovascular: Negative for chest pain, palpitations, hypertension, irregular heartbeat and swelling in legs/feet.  Gastrointestinal: Negative for constipation and diarrhea.  Endocrine: Negative for increased urination.  Musculoskeletal: Positive for arthralgias and joint pain. Negative for joint swelling, myalgias, muscle weakness, morning stiffness, muscle tenderness and myalgias.  Skin: Negative for color change, rash, hair loss, nodules/bumps, skin tightness, ulcers and sensitivity to sunlight.  Allergic/Immunologic: Negative for susceptible to infections.  Neurological: Negative for dizziness, fainting, memory loss and night sweats.  Hematological: Negative for swollen glands.  Psychiatric/Behavioral: Negative for depressed mood and sleep disturbance.  The patient is not nervous/anxious.     PMFS History:  Patient Active Problem List   Diagnosis Date Noted  . History of prostate cancer 12/05/2016  . Pain in both hands 12/05/2016  . Primary osteoarthritis of both hands 12/05/2016  . History of diabetes mellitus 12/05/2016  . ABSCESS OF ANAL AND RECTAL REGIONS 08/08/2010  . CHRONIC PANCREATITIS 04/12/2010  . LUQ PAIN 03/01/2010  . B12 DEFICIENCY 12/22/2009  . ENCOPRESIS 12/19/2009  . IBS 12/19/2009  . ABDOMINAL PAIN-EPIGASTRIC 12/19/2009  . COLONIC POLYPS, HX OF 09/09/2008  . HIATAL HERNIA 03/19/2008  . DIABETES MELLITUS, TYPE II 02/25/2008  . Dyslipidemia 02/25/2008  . Essential hypertension 02/25/2008  . GERD 02/25/2008  . BARRETTS ESOPHAGUS 02/25/2008  . Diverticulosis of colon 02/25/2008  . NEPHROLITHIASIS, HX OF 02/25/2008    Past Medical History:  Diagnosis Date  . Diabetes mellitus   . GERD (gastroesophageal reflux disease)   . Hyperlipidemia   . Hypertension   . Rectal fistula   . Wears glasses     Family History  Problem Relation Age of Onset  . Adopted: Yes   Past Surgical History:  Procedure Laterality Date  . ABDOMINAL EXPLORATION SURGERY  08/17/2010  . BLADDER REPAIR  08/17/2010  . COLECTOMY     partial for diverticulitis 1997  . COLONOSCOPY W/ POLYPECTOMY  2009   Dr.Patterson  . DIAGNOSTIC LAPAROSCOPY  08/17/2010   lysis of adhesions with bladder injury  . ELBOW SURGERY    . TREATMENT FISTULA ANAL  03/12/2011   fistuolotomy   Social History   Social History Narrative  . No narrative on file     Objective: Vital  Signs: BP (!) 144/72 (BP Location: Left Arm, Patient Position: Sitting, Cuff Size: Normal)   Pulse 62   Resp 13   Ht '5\' 5"'  (1.651 m)   Wt 149 lb (67.6 kg)   BMI 24.79 kg/m    Physical Exam  Constitutional: He is oriented to person, place, and time. He appears well-developed and well-nourished.  HENT:  Head: Normocephalic and atraumatic.  Eyes: Conjunctivae and EOM are  normal. Pupils are equal, round, and reactive to light.  Neck: Normal range of motion. Neck supple.  Cardiovascular: Normal rate, regular rhythm and normal heart sounds.   Pulmonary/Chest: Effort normal and breath sounds normal.  Abdominal: Soft. Bowel sounds are normal.  Neurological: He is alert and oriented to person, place, and time.  Skin: Skin is warm and dry. Capillary refill takes less than 2 seconds.  Psychiatric: He has a normal mood and affect. His behavior is normal.  Nursing note and vitals reviewed.    Musculoskeletal Exam: C-spine and thoracic lumbar spine good range of motion. He has thoracic kyphosis. Shoulder joints elbow joints wrist joints are good range of motion. He has DIP PIP thickening with no synovitis. He had tenderness on palpation over left lateral epicondyle area consistent with lateral epicondylitis.  CDAI Exam: No CDAI exam completed.    Investigation: Findings:  12/05/2016 CBC normal, CMP GFR 58, iron studies normal, ESR 11, RF negative, CCP negative, uric acid 5.6, magnesium 1.7     Imaging: [place] Extrem Up Bilat Comp  Result Date: 01/23/2017 Ultrasound examination of bilateral hands was performed per EULAR recommendations. Using 12 MHz transducer, grayscale and power Doppler bilateral second, third, and fifth MCP joints, bilateral second and third PIP joints and bilateral wrist joints both dorsal and volar aspects were evaluated to look for synovitis or tenosynovitis. The findings were there was no synovitis or tenosynovitis on ultrasound examination.Bilteral second and third PIP joint narrowing was noted. Right median nerve was 0.05 cm squares which was within normal limits and left median nerve was 0.07 cm squares which was within normal limits. Impression: Ultrasound examination of bilateral hands did not show any synovitis or tenosynovitis. The findings were consistent with osteoarthritis.   Speciality Comments: No specialty comments  available.    Procedures:  No procedures performed Allergies: Patient has no known allergies.   Assessment / Plan:     Visit Diagnoses: Primary osteoarthritis of both hands: Clinical findings, ultrasound findings and radiographic findings are consistent with osteoarthritis. He had no synovitis on ultrasound examination. Joint protection and muscle strengthening was discussed. I'll give him a prescription for topical Voltaren gel which can be used to were joints to relieve pain. Side effects of Voltaren gel were discussed. He may also use Tylenol as needed.  Lateral epicondylitis of left elbow: I have advised him to use of tennis elbow brace and Voltaren gel for right now. I discussed the option of cortisone injection in future if he has persistent symptoms. Physical therapy can also be useful.  Essential hypertension: His blood pressure is elevated today. I've advised him to monitor his blood pressure and follow-up with PCP.  Gastroesophageal reflux disease without esophagitis  Dyslipidemia  Diverticulosis of colon  Irritable bowel syndrome, unspecified type  NEPHROLITHIASIS, HX OF  History of diabetes mellitus  History of prostate cancer  Pain in both hands - Plan: [place] Extrem Up Bilat Comp    Orders: Orders Placed This Encounter  Procedures  . [place] Extrem Up Bilat Comp   Meds ordered this encounter  Medications  . diclofenac sodium (VOLTAREN) 1 % GEL    Sig: Apply 2 g topically 4 (four) times daily.    Dispense:  3 Tube    Refill:  0    Face-to-face time spent with patient was 25 minutes. 50% of time was spent in counseling and coordination of care.  Follow-Up Instructions: Return if symptoms worsen or fail to improve, for OA.   Bo Merino, MD  Note - This record has been created using Editor, commissioning.  Chart creation errors have been sought, but may not always  have been located. Such creation errors do not reflect on  the standard of medical care.

## 2018-04-16 ENCOUNTER — Encounter: Payer: Self-pay | Admitting: Gastroenterology
